# Patient Record
Sex: Female | Born: 1990 | Race: White | Hispanic: No | Marital: Single | Smoking: Current every day smoker
Health system: Southern US, Community
[De-identification: ages and names within clinical notes are randomized; demographics above are authoritative.]

## PROBLEM LIST (undated history)

## (undated) DIAGNOSIS — F419 Anxiety disorder, unspecified: Secondary | ICD-10-CM

## (undated) DIAGNOSIS — F329 Major depressive disorder, single episode, unspecified: Secondary | ICD-10-CM

## (undated) DIAGNOSIS — A749 Chlamydial infection, unspecified: Secondary | ICD-10-CM

## (undated) DIAGNOSIS — F32A Depression, unspecified: Secondary | ICD-10-CM

## (undated) DIAGNOSIS — A549 Gonococcal infection, unspecified: Secondary | ICD-10-CM

## (undated) DIAGNOSIS — R42 Dizziness and giddiness: Secondary | ICD-10-CM

## (undated) DIAGNOSIS — F4310 Post-traumatic stress disorder, unspecified: Secondary | ICD-10-CM

## (undated) DIAGNOSIS — H9319 Tinnitus, unspecified ear: Secondary | ICD-10-CM

## (undated) HISTORY — PX: APPENDECTOMY: SHX54

## (undated) HISTORY — DX: Tinnitus, unspecified ear: H93.19

## (undated) HISTORY — DX: Gonococcal infection, unspecified: A54.9

## (undated) HISTORY — DX: Chlamydial infection, unspecified: A74.9

## (undated) HISTORY — DX: Dizziness and giddiness: R42

---
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
## 2010-08-27 LAB — CBC
Hemoglobin: 11.1 g/dL — ABNORMAL LOW (ref 12.0–15.0)
MCHC: 33.8 g/dL (ref 30.0–36.0)
MCV: 92.7 fL (ref 78.0–100.0)
Platelets: 218 10*3/uL (ref 150–400)
RDW: 13.2 % (ref 11.5–15.5)
WBC: 14.3 10*3/uL — ABNORMAL HIGH (ref 4.0–10.5)

## 2010-08-27 LAB — CCBB MATERNAL DONOR DRAW

## 2013-08-28 ENCOUNTER — Emergency Department (HOSPITAL_COMMUNITY): Payer: Self-pay

## 2013-08-28 ENCOUNTER — Emergency Department (HOSPITAL_COMMUNITY)
Admission: EM | Admit: 2013-08-28 | Discharge: 2013-08-28 | Disposition: A | Discharge status: Home / Self Care | Payer: Self-pay | Attending: Emergency Medicine | Admitting: Emergency Medicine

## 2013-08-28 ENCOUNTER — Encounter (HOSPITAL_COMMUNITY): Payer: Self-pay | Admitting: Emergency Medicine

## 2013-08-28 DIAGNOSIS — S1093XA Contusion of unspecified part of neck, initial encounter: Principal | ICD-10-CM

## 2013-08-28 DIAGNOSIS — IMO0002 Reserved for concepts with insufficient information to code with codable children: Secondary | ICD-10-CM | POA: Insufficient documentation

## 2013-08-28 DIAGNOSIS — Z3202 Encounter for pregnancy test, result negative: Secondary | ICD-10-CM | POA: Insufficient documentation

## 2013-08-28 DIAGNOSIS — S0083XA Contusion of other part of head, initial encounter: Principal | ICD-10-CM

## 2013-08-28 DIAGNOSIS — F17200 Nicotine dependence, unspecified, uncomplicated: Secondary | ICD-10-CM | POA: Insufficient documentation

## 2013-08-28 DIAGNOSIS — G529 Cranial nerve disorder, unspecified: Secondary | ICD-10-CM | POA: Insufficient documentation

## 2013-08-28 DIAGNOSIS — S0003XA Contusion of scalp, initial encounter: Secondary | ICD-10-CM | POA: Insufficient documentation

## 2013-08-28 DIAGNOSIS — R209 Unspecified disturbances of skin sensation: Secondary | ICD-10-CM | POA: Insufficient documentation

## 2013-08-28 DIAGNOSIS — R200 Anesthesia of skin: Secondary | ICD-10-CM

## 2013-08-28 LAB — POC URINE PREG, ED: PREG TEST UR: NEGATIVE

## 2013-08-28 MED ORDER — ACETAMINOPHEN 325 MG PO TABS
650.0000 mg | ORAL_TABLET | Freq: Once | ORAL | Status: AC
  Administered 2013-08-28: 650 mg via ORAL
  Filled 2013-08-28: qty 2
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
## 2013-08-28 NOTE — Discharge Instructions (Signed)
Assault, General °Assault includes any behavior, whether intentional or reckless, which results in bodily injury to another person and/or damage to property. Included in this would be any behavior, intentional or reckless, that by its nature would be understood (interpreted) by a reasonable person as intent to harm another person or to damage his/her property. Threats may be oral or written. They may be communicated through regular mail, computer, fax, or phone. These threats may be direct or implied. °FORMS OF ASSAULT INCLUDE: °· Physically assaulting a person. This includes physical threats to inflict physical harm as well as: °· Slapping. °· Hitting. °· Poking. °· Kicking. °· Punching. °· Pushing. °· Arson. °· Sabotage. °· Equipment vandalism. °· Damaging or destroying property. °· Throwing or hitting objects. °· Displaying a weapon or an object that appears to be a weapon in a threatening manner. °· Carrying a firearm of any kind. °· Using a weapon to harm someone. °· Using greater physical size/strength to intimidate another. °· Making intimidating or threatening gestures. °· Bullying. °· Hazing. °· Intimidating, threatening, hostile, or abusive language directed toward another person. °· It communicates the intention to engage in violence against that person. And it leads a reasonable person to expect that violent behavior may occur. °· Stalking another person. °IF IT HAPPENS AGAIN: °· Immediately call for emergency help (911 in [place] °· If someone poses clear and immediate danger to you, seek legal authorities to have a protective or restraining order put in place. °· Less threatening assaults can at least be reported to authorities. °STEPS TO TAKE IF A SEXUAL ASSAULT HAS HAPPENED °· Go to an area of safety. This may include a shelter or staying with a friend. Stay away from the area where you have been attacked. A large percentage of sexual assaults are caused by a friend, relative or associate. °· If  medications were given by your caregiver, take them as directed for the full length of time prescribed. °· Only take over-the-counter or prescription medicines for pain, discomfort, or fever as directed by your caregiver. °· If you have come in contact with a sexual disease, find out if you are to be tested again. If your caregiver is concerned about the HIV/AIDS virus, he/she may require you to have continued testing for several months. °· For the protection of your privacy, test results can not be given over the phone. Make sure you receive the results of your test. If your test results are not back during your visit, make an appointment with your caregiver to find out the results. Do not assume everything is normal if you have not heard from your caregiver or the medical facility. It is important for you to follow up on all of your test results. °· File appropriate papers with authorities. This is important in all assaults, even if it has occurred in a family or by a friend. °SEEK MEDICAL CARE IF: °· You have new problems because of your injuries. °· You have problems that may be because of the medicine you are taking, such as: °· Rash. °· Itching. °· Swelling. °· Trouble breathing. °· You develop belly (abdominal) pain, feel sick to your stomach (nausea) or are vomiting. °· You begin to run a temperature. °· You need supportive care or referral to a rape crisis center. These are centers with trained personnel who can help you get through this ordeal. °SEEK IMMEDIATE MEDICAL CARE IF: °· You are afraid of being threatened, beaten, or abused. In [place] call 911. °· You   receive new injuries related to abuse. °· You develop severe pain in any area injured in the assault or have any change in your condition that concerns you. °· You faint or lose consciousness. °· You develop chest pain or shortness of breath. °Document Released: 05/12/2005 Document Revised: 08/04/2011 Document Reviewed: 12/29/2007 °ExitCare® Patient  Information ©2014 [employer]. ° ° ° ° °Emergency Department Resource Guide °1) Find a Doctor and Pay Out of Pocket °Although you won't have to find out who is covered by your insurance plan, it is a good idea to ask around and get recommendations. You will then need to call the office and see if the doctor you have chosen will accept you as a new patient and what types of options they offer for patients who are self-pay. Some doctors offer discounts or will set up payment plans for their patients who do not have insurance, but you will need to ask so you aren't surprised when you get to your appointment. ° °2) Contact Your Local Health Department °Not all health departments have doctors that can see patients for sick visits, but many do, so it is worth a call to see if yours does. If you don't know where your local health department is, you can check in your phone book. The CDC also has a tool to help you locate your state's health department, and many state websites also have listings of all of their local health departments. ° °3) Find a Walk-in Clinic °If your illness is not likely to be very severe or complicated, you may want to try a walk in clinic. These are popping up all over the country in pharmacies, drugstores, and shopping centers. They're usually staffed by nurse practitioners or physician assistants that have been trained to treat common illnesses and complaints. They're usually fairly quick and inexpensive. However, if you have serious medical issues or chronic medical problems, these are probably not your best option. ° °No Primary Care Doctor: °- Call [facility] at  832-8000 - they can help you locate a primary care doctor that  accepts your insurance, provides certain services, etc. °- Physician Referral Service- 1-800-533-3463 ° °Chronic Pain Problems: °Organization         Address  Phone   Notes  °[facility]  (336) 297-2271 Patients need to be referred by their  primary care doctor.  ° °Medication Assistance: °Organization         Address  Phone   Notes  °[place] [facility] [address] °[place], [place] [address] (336) 641-8030 --Must be a resident of [place] °-- Must have NO insurance coverage whatsoever (no Medicaid/ Medicare, etc.) °-- The pt. MUST have a primary care doctor that directs their care regularly and follows them in the community °  °[facility]  (866) 331-1348   °[employer]  (888) 892-1162   ° °Agencies that provide inexpensive medical care: °Organization         Address  Phone   Notes  °[facility]  (336) 832-8035   °[facility]    (336) 832-7272   °[facility] [address] °[place], [place] [address] (336) 832-4777   °[facility] [address], °[place] (336) 271-4999   °[facility]    (336) 373-0678   °[facility]    (336) 272-1050   °[facility] ° [address], [place] Phone:  (  336) 832-4444, Fax:  (336) 832-4440 Hours of Operation:  9 am - 6 pm, M-F.  Also accepts Medicaid/Medicare and self-pay.  °[facility] ° [address], [place] Phone: (336) 832-3150, Fax: (336) 832-3151. Hours of Operation:  8:30 am - 5:30 pm, M-F.  Also accepts Medicaid and self-pay.  °[facility] [place] [address], [place] Phone: (336) 878-6027   °[facility] [address], [place], [place] (336)723-1848, Ext. 123 Mondays & Thursdays: 7-9 AM.  First 15 patients are seen on a first come, first serve basis. °  ° °Medicaid-accepting [place] Providers: ° °Organization         Address  Phone   Notes  °[facility] [address], [place] (336) 641-2100 Also accepts self-pay patients.  °[facility] [address], [place] ° (336) 856-9996   °[facility] [address], [place] (336) 288-8857   °[facility] [address] [place] [address], [place] (336) 299-7000   °Veita Bland [address], [place]  ° (336) 373-1557 Only accepts [place] Access Medicaid patients after they have their name applied to their card.  ° °Self-Pay (no insurance) in [place]: ° °Organization         Address  Phone   Notes  °Sickle Cell Patients, [facility] [address], [place] (336) 832-1970   °[facility] [address], [place] (336) 832-4400   °[facility] ° [address], [place] (336) 992-4800   °[facility]/Dr. Osei-Bonsu ° [address] [place] [address], [place] or [address], [place] (336) 841-8500 Phone number for both [place] and [place] locations is the same.  °[facility] [address], [place] (336) 299-0000   °[facility] [address] [place] [address], [place] or [address] (336) 852-7530 °(336) 878-2260   °[facility] [address], [place] (336) 350-1642, phone; (336) 294-5005, fax Sees patients 1st and 3rd Saturday of every month.  Must not qualify for public or private insurance (i.e. Medicaid, Medicare, [place] Health Choice, Veterans' Benefits) • Household income should be no more than 200% of the poverty level •The clinic cannot treat you if you are pregnant or think you are pregnant • Sexually transmitted diseases are not treated at the clinic.  ° ° °Dental Care: °Organization         Address  Phone  Notes  °[facility] [address], [place] (336) 641-6152 Accepts children up to age 21 who are enrolled in Medicaid or [place] Health Choice; pregnant women with a Medicaid card; and children who have applied for Medicaid or [place] Health Choice, but were declined, whose parents can pay a reduced fee  at time of service.  °[facility] [place]  [address], [place] (336) 641-7733 Accepts children up to age 21 who are enrolled in Medicaid or [place] Health Choice; pregnant women with a Medicaid card; and children who have applied for Medicaid or [place] Health Choice, but were declined, whose parents can pay a reduced fee at time of service.  °[facility] ° [address],  (336) 641-4533 Patients are seen by appointment only. Walk-ins   are not accepted. [facility] will see patients 18 years of age and older. °Monday - Tuesday (8am-5pm) °Most Wednesdays (8:30-5pm) °$30 per visit, cash only  °[facility] ° [address], [place] (336) 641-4533 Patients are seen by appointment only. Walk-ins are not accepted. [facility] will see patients 18 years of age and older. °One Wednesday Evening (Monthly: Volunteer Based).  $30 per visit, cash only  °[facility]  (919) 537-3737 for adults; Children under age 4, call Graduate Pediatric Dentistry at ([phone]) [phone]. Children aged 4-14, please call ([phone]) [phone] to request a pediatric application. ° Dental services are provided in all areas of dental care including fillings, crowns and bridges, complete and partial dentures, implants, gum treatment, root canals, and extractions. Preventive care is also provided. Treatment is provided to both adults and children. °Patients are selected via a lottery and there is often a waiting list. °  °[facility] [address], °[place] ° (336) 763-8833 [email or web address] °  °[facility] [address], [place], [place] (336)723-1848, Ext. 123 Second and Fourth Thursday of each month, opens at 6:30 AM; Clinic ends at 9 AM.  Patients are seen on a first-come first-served basis, and a limited number are seen during each clinic.  ° °[facility] ° [address],  [place], [place] (336) 723-7904   Eligibility Requirements °You must have lived in [place], [place], or [place] counties for at least the last three months. °  You cannot be eligible for state or federal sponsored healthcare insurance, including Veterans Administration, Medicaid, or Medicare. °  You generally cannot be eligible for healthcare insurance through your employer.  °  How to apply: °Eligibility screenings are held every Tuesday and Wednesday afternoon from 1:00 pm until 4:00 pm. You do not need an appointment for the interview!  °[facility] [address], [place], [place] 336-631-2330   °[facility]  336-342-8273   °[facility]  336-703-3100   °[facility]  336-570-6415   ° °Behavioral Health Resources in the Community: °Intensive Outpatient Programs °Organization         Address  Phone  Notes  °[facility] [address], [place], [place] 336-878-6098   °[facility] [address], [place], [place] 336-832-9800   °[facility] [address], [place], [place] ° 336-882-2125   °[facility] [address],  °[place], [place] 1-800-853-5163 or 336-641-4981   °Substance Abuse Resources °Organization         Address  Phone  Notes  °[facility]  336-882-2125   °[facility]  336-784-9470   °[facility]  336-285-9073   °[facility]  336-845-3988   °Residential & Outpatient Substance Abuse Program  1-800-659-3381   °Psychological Services °Organization         Address  Phone  Notes  °[facility]  336- 832-9600   °[facility]  336- 378-7881   °[facility] [address], [place] 1-800-853-5163 or 336-641-4981   ° °Mobile Crisis Teams °Organization         Address  Phone  Notes  °[facility], Mobile Crisis Care Unit  1-877-626-1772   °Assertive °Psychotherapeutic  Services ° [address] [place], [place] 336-834-9664   °Sharon DeEsch [address] °[place]  336-554-5454   ° °  Self-Help/Support Groups °Organization         Address  Phone             Notes  °[facility] - variety of support groups  336- 373-1402 Call for more information  °Narcotics Anonymous (NA), [facility] [address], °[place]  2 meetings at this location  ° °Residential Treatment Programs °Organization         Address  Phone  Notes  °[facility] [address],    °[place]  1-866-801-8205   °[facility] ° [address], [place], [place] 704-293-8524   °[facility] [address], [place] 336-845-3988 Admissions: 8am-3pm M-F  °[facility] [address]    °[place], [place] 336-841-1104   °[facility] [address], [place], [place] 336-379-7146   °[facility] [address]  °[place], [place] 336-285-9073   °[facility] - Intensive Outpatient [address], [place], [place] 336-852-3033   °[facility] [address]  °[place], [place] 1-877-615-2722 or 336-784-9470   °[facility] [address] [place], [place] 336-227-7417 Accepts Medicaid  °[facility] [address]  °[place] 1-800-659-3381 Substance Abuse/Addiction Treatment  ° °[place] Behavioral Health Resources °Organization         Address  Phone  Notes  °[facility]  (888) 581-9988   °Julie Brannon, PhD [address] [place], [place]   (336) 349-5553 or (336) 951-0000   °[facility]   [address] °[place], [place] (336) 349-4454   °[facility] [address], [place], [place] (336) 342-8316 Insurance/Medicaid/sponsorship through [facility]  °[facility] [address]                                    [place], [place] (336)  342-8316 Therapy/tele-psych/case  °[facility] [address]  ° [place], [place] (336) 349-2233    °Dr. Arfeen  (336) 349-4544   °[facility] [place]  [employer] [place] [facility] 1) [address], [place] °2) [address], [place] °3)  [address], [place] (336) 349-3220 °(336) 342-7768 ° °(336) 342-8140   °[place] [facility] (336) 342-1394 or (336) 342-3537 (After Hours)    ° °  °
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
## 2013-08-28 NOTE — ED Notes (Signed)
lmp 2 weeks ago 

## 2013-08-28 NOTE — ED Notes (Signed)
Password for patient is  [phone]

## 2013-08-28 NOTE — ED Notes (Signed)
Pt returned from Radiology.  Reports pain 9/10 to face/head.

## 2014-02-24 ENCOUNTER — Emergency Department (HOSPITAL_COMMUNITY)
Admission: EM | Admit: 2014-02-24 | Discharge: 2014-02-24 | Disposition: A | Discharge status: Home / Self Care | Payer: Self-pay | Attending: Emergency Medicine | Admitting: Emergency Medicine

## 2014-02-24 ENCOUNTER — Encounter (HOSPITAL_COMMUNITY): Payer: Self-pay | Admitting: Emergency Medicine

## 2014-02-24 DIAGNOSIS — Z3202 Encounter for pregnancy test, result negative: Secondary | ICD-10-CM | POA: Insufficient documentation

## 2014-02-24 DIAGNOSIS — T148XXA Other injury of unspecified body region, initial encounter: Secondary | ICD-10-CM

## 2014-02-24 DIAGNOSIS — F419 Anxiety disorder, unspecified: Secondary | ICD-10-CM | POA: Insufficient documentation

## 2014-02-24 DIAGNOSIS — R0602 Shortness of breath: Secondary | ICD-10-CM | POA: Insufficient documentation

## 2014-02-24 DIAGNOSIS — F329 Major depressive disorder, single episode, unspecified: Secondary | ICD-10-CM | POA: Insufficient documentation

## 2014-02-24 DIAGNOSIS — R110 Nausea: Secondary | ICD-10-CM | POA: Insufficient documentation

## 2014-02-24 DIAGNOSIS — M7981 Nontraumatic hematoma of soft tissue: Secondary | ICD-10-CM | POA: Insufficient documentation

## 2014-02-24 DIAGNOSIS — Z720 Tobacco use: Secondary | ICD-10-CM | POA: Insufficient documentation

## 2014-02-24 DIAGNOSIS — Z79899 Other long term (current) drug therapy: Secondary | ICD-10-CM | POA: Insufficient documentation

## 2014-02-24 DIAGNOSIS — R079 Chest pain, unspecified: Secondary | ICD-10-CM | POA: Insufficient documentation

## 2014-02-24 HISTORY — DX: Anxiety disorder, unspecified: F41.9

## 2014-02-24 HISTORY — DX: Major depressive disorder, single episode, unspecified: F32.9

## 2014-02-24 HISTORY — DX: Depression, unspecified: F32.A

## 2014-02-24 HISTORY — DX: Post-traumatic stress disorder, unspecified: F43.10

## 2014-02-24 LAB — COMPREHENSIVE METABOLIC PANEL
ALBUMIN: 4.1 g/dL (ref 3.5–5.2)
ALK PHOS: 76 U/L (ref 39–117)
ALT: 25 U/L (ref 0–35)
ANION GAP: 12 (ref 5–15)
AST: 33 U/L (ref 0–37)
BILIRUBIN TOTAL: 0.3 mg/dL (ref 0.3–1.2)
BUN: 11 mg/dL (ref 6–23)
CHLORIDE: 98 meq/L (ref 96–112)
CO2: 25 meq/L (ref 19–32)
CREATININE: 0.64 mg/dL (ref 0.50–1.10)
Calcium: 9.2 mg/dL (ref 8.4–10.5)
GLUCOSE: 85 mg/dL (ref 70–99)
POTASSIUM: 4.5 meq/L (ref 3.7–5.3)
Sodium: 135 mEq/L — ABNORMAL LOW (ref 137–147)
Total Protein: 7.7 g/dL (ref 6.0–8.3)

## 2014-02-24 LAB — CBC WITH DIFFERENTIAL/PLATELET
Basophils Absolute: 0 10*3/uL (ref 0.0–0.1)
Basophils Relative: 1 % (ref 0–1)
Eosinophils Absolute: 0.2 10*3/uL (ref 0.0–0.7)
Eosinophils Relative: 2 % (ref 0–5)
HEMATOCRIT: 40.9 % (ref 36.0–46.0)
HEMOGLOBIN: 14.2 g/dL (ref 12.0–15.0)
LYMPHS ABS: 2.3 10*3/uL (ref 0.7–4.0)
Lymphocytes Relative: 36 % (ref 12–46)
MCH: 32.6 pg (ref 26.0–34.0)
MCHC: 34.7 g/dL (ref 30.0–36.0)
MCV: 94 fL (ref 78.0–100.0)
MONO ABS: 0.5 10*3/uL (ref 0.1–1.0)
MONOS PCT: 7 % (ref 3–12)
NEUTROS ABS: 3.5 10*3/uL (ref 1.7–7.7)
NEUTROS PCT: 54 % (ref 43–77)
Platelets: 192 10*3/uL (ref 150–400)
RBC: 4.35 MIL/uL (ref 3.87–5.11)
RDW: 12.2 % (ref 11.5–15.5)
WBC: 6.5 10*3/uL (ref 4.0–10.5)

## 2014-02-24 LAB — PROTIME-INR
INR: 1.04 (ref 0.00–1.49)
Prothrombin Time: 13.6 seconds (ref 11.6–15.2)

## 2014-02-24 LAB — SEDIMENTATION RATE: SED RATE: 1 mm/h (ref 0–22)

## 2014-02-24 LAB — PREGNANCY, URINE: PREG TEST UR: NEGATIVE

## 2014-02-24 LAB — APTT: aPTT: 31 seconds (ref 24–37)

## 2014-02-24 MED ORDER — ONDANSETRON 4 MG PO TBDP: 4.0000 mg | ORAL_TABLET | Freq: Three times a day (TID) | ORAL | Status: DC | PRN

## 2014-02-24 MED ORDER — ONDANSETRON HCL 4 MG/2ML IJ SOLN
4.0000 mg | Freq: Once | INTRAMUSCULAR | Status: AC
  Administered 2014-02-24: 4 mg via INTRAVENOUS
  Filled 2014-02-24: qty 2

## 2014-02-24 NOTE — ED Notes (Addendum)
Intermittent rashes for past 3 days. Reports tiny bruising to legs, Cramps in back, nausea. Redness to face, no diff breathing noted. Also reports bleeding gums for past few weeks while brushing teeth.

## 2014-02-24 NOTE — Discharge Instructions (Signed)
Contusion °A contusion is a deep bruise. Contusions happen when an injury causes bleeding under the skin. Signs of bruising include pain, puffiness (swelling), and discolored skin. The contusion may turn blue, purple, or yellow. °HOME CARE  °· Put ice on the injured area. °¨ Put ice in a plastic bag. °¨ Place a towel between your skin and the bag. °¨ Leave the ice on for 15-20 minutes, 03-04 times a day. °· Only take medicine as told by your doctor. °· Rest the injured area. °· If possible, raise (elevate) the injured area to lessen puffiness. °GET HELP RIGHT AWAY IF:  °· You have more bruising or puffiness. °· You have pain that is getting worse. °· Your puffiness or pain is not helped by medicine. °MAKE SURE YOU:  °· Understand these instructions. °· Will watch your condition. °· Will get help right away if you are not doing well or get worse. °Document Released: 10/29/2007 Document Revised: 08/04/2011 Document Reviewed: 03/17/2011 °[employer]® Patient Information ©2015 [employer]. This information is not intended to replace advice given to you by your health care provider. Make sure you discuss any questions you have with your health care provider. ° °

## 2014-02-24 NOTE — ED Provider Notes (Signed)
CSN: [id number]     Arrival date & time 02/24/14  [id number] History   First MD Initiated Contact with Patient 02/24/14 [phone]     Chief Complaint  Patient presents with  . Rash  . Nausea     (Consider location/radiation/quality/duration/timing/severity/associated sxs/prior Treatment) Patient is a 23 y.o. female presenting with rash.  Rash Location:  Full body Quality: redness   Quality: not itchy   Severity:  Moderate Onset quality:  Gradual Duration:  3 days Timing:  Constant Progression:  Waxing and waning Chronicity:  New Context comment:  Tick bite 1 month ago Relieved by:  None tried Worsened by:  Nothing tried Ineffective treatments:  None tried Associated symptoms: nausea and shortness of breath   Associated symptoms: no abdominal pain, no diarrhea, no fever, no headaches, no joint pain, no myalgias, no sore throat, no URI, not vomiting and not wheezing     Past Medical History  Diagnosis Date  . Depression   . Anxiety   . PTSD (post-traumatic stress disorder)    Past Surgical History  Procedure Laterality Date  . Appendectomy     No family history on file. History  Substance Use Topics  . Smoking status: Current Every Day Smoker  . Smokeless tobacco: Not on file  . Alcohol Use: No   OB History   Grav Para Term Preterm Abortions TAB SAB Ect Mult Living                 Review of Systems  Constitutional: Negative for fever and chills.  HENT: Negative for congestion and sore throat.   Eyes: Negative for visual disturbance.  Respiratory: Positive for shortness of breath. Negative for cough and wheezing.   Cardiovascular: Positive for chest pain.  Gastrointestinal: Positive for nausea. Negative for vomiting, abdominal pain, diarrhea and constipation.  Genitourinary: Negative for dysuria, difficulty urinating and vaginal pain.  Musculoskeletal: Negative for arthralgias and myalgias.  Skin: Positive for rash.  Neurological: Negative for syncope and headaches.   Hematological: Bruises/bleeds easily.  Psychiatric/Behavioral: Negative for behavioral problems.  All other systems reviewed and are negative.     Allergies  Review of patient's allergies indicates no known allergies.  Home Medications   Prior to Admission medications   Medication Sig Start Date End Date Taking? Authorizing Provider  clonazePAM (KLONOPIN) 0.5 MG tablet Take 0.5 mg by mouth 2 (two) times daily.   Yes Historical Provider, MD  traZODone (DESYREL) 50 MG tablet Take 100 mg by mouth at bedtime.   Yes Historical Provider, MD  venlafaxine XR (EFFEXOR-XR) 150 MG 24 hr capsule Take 150 mg by mouth daily.   Yes Historical Provider, MD  ondansetron (ZOFRAN ODT) 4 MG disintegrating tablet Take 1 tablet (4 mg total) by mouth every 8 (eight) hours as needed for nausea or vomiting. 02/24/14   Beverely Risenennis Roseanne Juenger, MD   Ht 4\' 11"  (1.499 m)  Wt 111 lb (50.349 kg)  BMI 22.41 kg/m2  LMP 02/20/2014 Physical Exam  Constitutional: She is oriented to person, place, and time. She appears well-developed and well-nourished. No distress.  HENT:  Head: Normocephalic and atraumatic.  Eyes: EOM are normal.  Neck: Normal range of motion.  Cardiovascular: Normal rate, regular rhythm and normal heart sounds.   No murmur heard. Pulmonary/Chest: Effort normal and breath sounds normal. No respiratory distress. She has no wheezes.  Abdominal: Soft. There is no tenderness.  Musculoskeletal: She exhibits no edema.  Neurological: She is alert and oriented to person, place, and time.  Skin:  Bruising and rash noted. She is not diaphoretic. There is erythema.  Psychiatric: She has a normal mood and affect. Her behavior is normal.    ED Course  Procedures (including critical care time) Labs Review Labs Reviewed  COMPREHENSIVE METABOLIC PANEL - Abnormal; Notable for the following:    Sodium 135 (*)    All other components within normal limits  PREGNANCY, URINE  CBC WITH DIFFERENTIAL  APTT  PROTIME-INR   SEDIMENTATION RATE    Imaging Review No results found.   EKG Interpretation None      MDM   Final diagnoses:  Bruising  Nausea    Patient is a 23 year old female with history significant for depression and anxiety that presents with 3 days of rash, nausea, bruising. Patient also states she has had bleeding of her gums. Patient states one month ago she pulled a tick from the back of her head however denies any fever. Patient states that the rash started as red bumps on her face that has since gone away. Rash then went to her upper extremities. Yesterday the patient noticed bruising diffusely on her lower extremities bilaterally and denies trauma. Patient states she is also noticed bleeding of her gums this started 2 days ago. Patient denies any family history of bleeding disorder and is taking no new medications. Patient is currently taking Effexor, Trazodone, and clonopin.  Patient's lab work unremarkable. Patient given resources to establish with a primary care. Patient given a return precautions which he voiced understanding. Patient discharged in good condition    Beverely Risen, MD 02/24/14 1159

## 2014-02-24 NOTE — ED Provider Notes (Signed)
I saw and evaluated the patient, reviewed the resident's note and I agree with the findings and plan.   EKG Interpretation None     I have seen the patient and reviewed her history present illness and review of systems. At this point time I have low suspicion for any zoo and not infection despite this history of a tick bite approximately a month ago. There are no typical symptoms of a febrile illness headaches myalgias URI/respiratory type symptoms. She is well in appearance. She describes the urticarial rash which occurred approximately 3 days ago with erythema of her face and upper extremities that then resolved and now she has a few residual papular lesions that have no classic distribution and actually appear more consistent with a contact dermatitis very mild in nature. Examination of her lower extremities does show variable bruising a few of which are clearly from minor injuries. Again there is no petechiae no nodules to suggest erythema nodosum. Examination of patient oral cavity shows that she has gingivitis and plaque, and bleed any bleeding of the gums is secondary to poor oral hygiene. The palate and the buccal mucosa are free of any petechial type lesions. Patient's diagnostic workup is negative. She is discharged in stable condition.  Arby BarretteMarcy Zivah Mayr, MD 02/24/14 [phone]

## 2014-02-24 NOTE — ED Provider Notes (Signed)
I saw and evaluated the patient, reviewed the resident's note and I agree with the findings and plan.   EKG Interpretation None       Arby BarretteMarcy Braxtyn Dorff, MD 02/24/14 1655

## 2014-02-24 NOTE — ED Notes (Signed)
MD at bedside. Cheyenne Kelly.

## 2014-06-22 ENCOUNTER — Emergency Department (HOSPITAL_COMMUNITY): Payer: Self-pay

## 2014-06-22 ENCOUNTER — Emergency Department (HOSPITAL_COMMUNITY)
Admission: EM | Admit: 2014-06-22 | Discharge: 2014-06-22 | Disposition: A | Discharge status: Home / Self Care | Payer: Self-pay | Attending: Emergency Medicine | Admitting: Emergency Medicine

## 2014-06-22 ENCOUNTER — Encounter (HOSPITAL_COMMUNITY): Payer: Self-pay

## 2014-06-22 DIAGNOSIS — R0602 Shortness of breath: Secondary | ICD-10-CM | POA: Insufficient documentation

## 2014-06-22 DIAGNOSIS — R0789 Other chest pain: Secondary | ICD-10-CM | POA: Insufficient documentation

## 2014-06-22 DIAGNOSIS — Z79899 Other long term (current) drug therapy: Secondary | ICD-10-CM | POA: Insufficient documentation

## 2014-06-22 DIAGNOSIS — R112 Nausea with vomiting, unspecified: Secondary | ICD-10-CM | POA: Insufficient documentation

## 2014-06-22 DIAGNOSIS — F419 Anxiety disorder, unspecified: Secondary | ICD-10-CM | POA: Insufficient documentation

## 2014-06-22 DIAGNOSIS — F329 Major depressive disorder, single episode, unspecified: Secondary | ICD-10-CM | POA: Insufficient documentation

## 2014-06-22 DIAGNOSIS — Z720 Tobacco use: Secondary | ICD-10-CM | POA: Insufficient documentation

## 2014-06-22 DIAGNOSIS — F4310 Post-traumatic stress disorder, unspecified: Secondary | ICD-10-CM | POA: Insufficient documentation

## 2014-06-22 DIAGNOSIS — Z3202 Encounter for pregnancy test, result negative: Secondary | ICD-10-CM | POA: Insufficient documentation

## 2014-06-22 DIAGNOSIS — R1084 Generalized abdominal pain: Secondary | ICD-10-CM | POA: Insufficient documentation

## 2014-06-22 LAB — COMPREHENSIVE METABOLIC PANEL
ALBUMIN: 4.5 g/dL (ref 3.5–5.2)
ALT: 32 U/L (ref 0–35)
ANION GAP: 10 (ref 5–15)
AST: 35 U/L (ref 0–37)
Alkaline Phosphatase: 71 U/L (ref 39–117)
BUN: 8 mg/dL (ref 6–23)
CHLORIDE: 103 mmol/L (ref 96–112)
CO2: 23 mmol/L (ref 19–32)
Calcium: 9.1 mg/dL (ref 8.4–10.5)
Creatinine, Ser: 0.59 mg/dL (ref 0.50–1.10)
GFR calc non Af Amer: >90 mL/min (ref >90)
GLUCOSE: 84 mg/dL (ref 70–99)
POTASSIUM: 3.6 mmol/L (ref 3.5–5.1)
SODIUM: 136 mmol/L (ref 135–145)
TOTAL PROTEIN: 7.2 g/dL (ref 6.0–8.3)
Total Bilirubin: 0.7 mg/dL (ref 0.3–1.2)

## 2014-06-22 LAB — CBC WITH DIFFERENTIAL/PLATELET
BASOS ABS: 0 10*3/uL (ref 0.0–0.1)
Basophils Relative: 0 % (ref 0–1)
Eosinophils Absolute: 0.1 10*3/uL (ref 0.0–0.7)
Eosinophils Relative: 1 % (ref 0–5)
HEMATOCRIT: 37.2 % (ref 36.0–46.0)
HEMOGLOBIN: 13 g/dL (ref 12.0–15.0)
LYMPHS ABS: 2.1 10*3/uL (ref 0.7–4.0)
Lymphocytes Relative: 30 % (ref 12–46)
MCH: 32.4 pg (ref 26.0–34.0)
MCHC: 34.9 g/dL (ref 30.0–36.0)
MCV: 92.8 fL (ref 78.0–100.0)
Monocytes Absolute: 0.5 10*3/uL (ref 0.1–1.0)
Monocytes Relative: 7 % (ref 3–12)
NEUTROS ABS: 4.4 10*3/uL (ref 1.7–7.7)
Neutrophils Relative %: 62 % (ref 43–77)
Platelets: 184 10*3/uL (ref 150–400)
RBC: 4.01 MIL/uL (ref 3.87–5.11)
RDW: 12 % (ref 11.5–15.5)
WBC: 7.1 10*3/uL (ref 4.0–10.5)

## 2014-06-22 LAB — URINALYSIS, ROUTINE W REFLEX MICROSCOPIC
BILIRUBIN URINE: NEGATIVE
Glucose, UA: NEGATIVE mg/dL
HGB URINE DIPSTICK: NEGATIVE
KETONES UR: NEGATIVE mg/dL
LEUKOCYTES UA: NEGATIVE
Nitrite: NEGATIVE
Protein, ur: NEGATIVE mg/dL
Specific Gravity, Urine: 1.004 — ABNORMAL LOW (ref 1.005–1.030)
UROBILINOGEN UA: 0.2 mg/dL (ref 0.0–1.0)
pH: 6.5 (ref 5.0–8.0)

## 2014-06-22 LAB — POC URINE PREG, ED: Preg Test, Ur: NEGATIVE

## 2014-06-22 LAB — I-STAT TROPONIN, ED: TROPONIN I, POC: 0 ng/mL (ref 0.00–0.08)

## 2014-06-22 MED ORDER — KETOROLAC TROMETHAMINE 30 MG/ML IJ SOLN
30.0000 mg | Freq: Once | INTRAMUSCULAR | Status: AC
  Administered 2014-06-22: 30 mg via INTRAMUSCULAR
  Filled 2014-06-22: qty 1

## 2014-06-22 MED ORDER — ONDANSETRON HCL 4 MG PO TABS: 4.0000 mg | ORAL_TABLET | Freq: Three times a day (TID) | ORAL | Status: DC | PRN

## 2014-06-22 MED ORDER — NAPROXEN 375 MG PO TABS: 375.0000 mg | ORAL_TABLET | Freq: Two times a day (BID) | ORAL | Status: DC

## 2014-06-22 NOTE — ED Provider Notes (Signed)
CSN: [id number]     Arrival date & time 06/22/14  1814 History   First MD Initiated Contact with Patient 06/22/14 1903     Chief Complaint  Patient presents with  . Chest Pain  . Emesis     (Consider location/radiation/quality/duration/timing/severity/associated sxs/prior Treatment) Patient is a 24 y.o. female presenting with chest pain and vomiting. The history is provided by the patient.  Chest Pain Pain location:  L chest Pain quality: shooting   Pain radiates to:  Upper back Pain radiates to the back: yes   Pain severity:  Moderate Onset quality:  Gradual Duration:  2 weeks Timing:  Constant Progression:  Unchanged Chronicity:  New Context: at rest   Relieved by:  Nothing Worsened by:  Nothing tried Ineffective treatments:  None tried Associated symptoms: abdominal pain, nausea, shortness of breath and vomiting   Associated symptoms: no back pain, no cough, no dizziness, no fatigue, no fever and no headache   Abdominal pain:    Location:  Generalized   Quality:  Cramping   Severity:  Mild   Onset quality:  Gradual   Duration:  2 weeks   Timing:  Constant   Progression:  Unchanged   Chronicity:  New Vomiting:    Quality:  Stomach contents   Severity:  Mild   Duration:  1 month   Timing:  Sporadic   Progression:  Unchanged Emesis Associated symptoms: abdominal pain   Associated symptoms: no diarrhea and no headaches     Past Medical History  Diagnosis Date  . Depression   . Anxiety   . PTSD (post-traumatic stress disorder)    Past Surgical History  Procedure Laterality Date  . Appendectomy     History reviewed. No pertinent family history. History  Substance Use Topics  . Smoking status: Current Every Day Smoker  . Smokeless tobacco: Not on file  . Alcohol Use: No   OB History    No data available     Review of Systems  Constitutional: Negative for fever and fatigue.  HENT: Negative for congestion and drooling.   Eyes: Negative for pain.   Respiratory: Positive for shortness of breath. Negative for cough.   Cardiovascular: Positive for chest pain.  Gastrointestinal: Positive for nausea, vomiting and abdominal pain. Negative for diarrhea.  Genitourinary: Negative for dysuria and hematuria.  Musculoskeletal: Negative for back pain, gait problem and neck pain.  Skin: Negative for color change.  Neurological: Negative for dizziness and headaches.  Hematological: Negative for adenopathy.  Psychiatric/Behavioral: Negative for behavioral problems.  All other systems reviewed and are negative.     Allergies  Review of patient's allergies indicates no known allergies.  Home Medications   Prior to Admission medications   Medication Sig Start Date End Date Taking? Authorizing Provider  clonazePAM (KLONOPIN) 0.5 MG tablet Take 0.5 mg by mouth 2 (two) times daily.    Historical Provider, MD  ondansetron (ZOFRAN ODT) 4 MG disintegrating tablet Take 1 tablet (4 mg total) by mouth every 8 (eight) hours as needed for nausea or vomiting. 02/24/14   Beverely Risenennis Carr, MD  traZODone (DESYREL) 50 MG tablet Take 100 mg by mouth at bedtime.    Historical Provider, MD  venlafaxine XR (EFFEXOR-XR) 150 MG 24 hr capsule Take 150 mg by mouth daily.    Historical Provider, MD   BP 139/93 mmHg  Pulse 77  Temp(Src) 97.5 F (36.4 C) (Oral)  Resp 18  SpO2 97%  LMP 06/17/2014 Physical Exam  Constitutional: She is oriented  to person, place, and time. She appears well-developed and well-nourished.  HENT:  Head: Normocephalic.  Mouth/Throat: Oropharynx is clear and moist. No oropharyngeal exudate.  Eyes: Conjunctivae and EOM are normal. Pupils are equal, round, and reactive to light.  Neck: Normal range of motion. Neck supple.  Cardiovascular: Normal rate, regular rhythm, normal heart sounds and intact distal pulses.  Exam reveals no gallop and no friction rub.   No murmur heard. Pulmonary/Chest: Effort normal and breath sounds normal. No respiratory  distress. She has no wheezes. She exhibits tenderness (Tenderness to palpation of the left upper anterior chest and left lateral chest wall.).  Abdominal: Soft. Bowel sounds are normal. There is no tenderness. There is no rebound and no guarding.  Musculoskeletal: Normal range of motion. She exhibits no edema or tenderness.  Neurological: She is alert and oriented to person, place, and time.  Skin: Skin is warm and dry.  Psychiatric: She has a normal mood and affect. Her behavior is normal.  Nursing note and vitals reviewed.   ED Course  Procedures (including critical care time) Labs Review Labs Reviewed  URINALYSIS, ROUTINE W REFLEX MICROSCOPIC - Abnormal; Notable for the following:    Specific Gravity, Urine 1.004 (*)    All other components within normal limits  CBC WITH DIFFERENTIAL/PLATELET  COMPREHENSIVE METABOLIC PANEL  I-STAT TROPOININ, ED  POC URINE PREG, ED    Imaging Review Dg Chest 2 View  06/22/2014   CLINICAL DATA:  Vomiting several times a day for several weeks. Left side chest pain radiating to the back with shortness of breath. Smoker.  EXAM: CHEST  2 VIEW  COMPARISON:  PA and lateral chest 08/28/2013.  FINDINGS: The lungs are clear. Heart size is normal. There is no pneumothorax or pleural effusion. Mild convex right thoracolumbar scoliosis is noted.  IMPRESSION: No acute disease.   Electronically Signed   By: Drusilla Kanner M.D.   On: 06/22/2014 19:13     EKG Interpretation   Date/Time:  Thursday June 22 2014 18:23:57 EST Ventricular Rate:  81 PR Interval:  134 QRS Duration: 82 QT Interval:  352 QTC Calculation: 408 R Axis:   90 Text Interpretation:  Normal sinus rhythm Rightward axis Septal infarct ,  age undetermined Confirmed by Vianne Grieshop  MD, Alonso Gapinski ([id number]) on 06/22/2014  7:04:00 PM      MDM   Final diagnoses:  Other chest pain  Generalized abdominal pain    7:49 PM 24 y.o. female with a history of depression, anxiety, PTSD who presents with  multiple complaints. She states that she has had vomiting daily for the last 5 weeks. She also complains of left-sided sharp chest pain radiating to her back which has been constant for the last 1.5 weeks. She believes it may be related to anxiety. She also complains of diffuse abdominal cramping for the last 1.5 weeks. She states she feels short of breath. She is also had some urinary frequency. She denies any fevers. She is afebrile and vital signs are unremarkable here. Wells/Perc neg. lab work drawn prior to my evaluation is noncontributory. The patient is well-appearing on exam. Will get urinalysis and urine pregnancy. Her chest pain is reproducible on exam.  8:48 PM: I interpreted/reviewed the labs and/or imaging which were non-contributory.  Pt continues to appear well.  I have discussed the diagnosis/risks/treatment options with the patient and believe the pt to be eligible for discharge home to follow-up with and estab w/ a pcp. We also discussed returning to the ED immediately  if new or worsening sx occur. We discussed the sx which are most concerning (e.g., worsening pain, fever, concern for dehydration) that necessitate immediate return. Medications administered to the patient during their visit and any new prescriptions provided to the patient are listed below.  Medications given during this visit Medications  ketorolac (TORADOL) 30 MG/ML injection 30 mg (not administered)    Discharge Medication List as of 06/22/2014  8:53 PM    START taking these medications   Details  naproxen (NAPROSYN) 375 MG tablet Take 1 tablet (375 mg total) by mouth 2 (two) times daily with a meal., Starting 06/22/2014, Until Discontinued, Print    ondansetron (ZOFRAN) 4 MG tablet Take 1 tablet (4 mg total) by mouth every 8 (eight) hours as needed for nausea or vomiting., Starting 06/22/2014, Until Discontinued, Print         Purvis Sheffield, MD 06/22/14 2353

## 2014-06-22 NOTE — Discharge Instructions (Signed)
Chest Pain (Nonspecific) °It is often hard to give a specific diagnosis for the cause of chest pain. There is always a chance that your pain could be related to something serious, such as a heart attack or a blood clot in the lungs. You need to follow up with your health care provider for further evaluation. °CAUSES  °· Heartburn. °· Pneumonia or bronchitis. °· Anxiety or stress. °· Inflammation around your heart (pericarditis) or lung (pleuritis or pleurisy). °· A blood clot in the lung. °· A collapsed lung (pneumothorax). It can develop suddenly on its own (spontaneous pneumothorax) or from trauma to the chest. °· Shingles infection (herpes zoster virus). °The chest wall is composed of bones, muscles, and cartilage. Any of these can be the source of the pain. °· The bones can be bruised by injury. °· The muscles or cartilage can be strained by coughing or overwork. °· The cartilage can be affected by inflammation and become sore (costochondritis). °DIAGNOSIS  °Lab tests or other studies may be needed to find the cause of your pain. Your health care provider may have you take a test called an ambulatory electrocardiogram (ECG). An ECG records your heartbeat patterns over a 24-hour period. You may also have other tests, such as: °· Transthoracic echocardiogram (TTE). During echocardiography, sound waves are used to evaluate how blood flows through your heart. °· Transesophageal echocardiogram (TEE). °· Cardiac monitoring. This allows your health care provider to monitor your heart rate and rhythm in real time. °· Holter monitor. This is a portable device that records your heartbeat and can help diagnose heart arrhythmias. It allows your health care provider to track your heart activity for several days, if needed. °· Stress tests by exercise or by giving medicine that makes the heart beat faster. °TREATMENT  °· Treatment depends on what may be causing your chest pain. Treatment may include: °· Acid blockers for  heartburn. °· Anti-inflammatory medicine. °· Pain medicine for inflammatory conditions. °· Antibiotics if an infection is present. °· You may be advised to change lifestyle habits. This includes stopping smoking and avoiding alcohol, caffeine, and chocolate. °· You may be advised to keep your head raised (elevated) when sleeping. This reduces the chance of acid going backward from your stomach into your esophagus. °Most of the time, nonspecific chest pain will improve within 2-3 days with rest and mild pain medicine.  °HOME CARE INSTRUCTIONS  °· If antibiotics were prescribed, take them as directed. Finish them even if you start to feel better. °· For the next few days, avoid physical activities that bring on chest pain. Continue physical activities as directed. °· Do not use any tobacco products, including cigarettes, chewing tobacco, or electronic cigarettes. °· Avoid drinking alcohol. °· Only take medicine as directed by your health care provider. °· Follow your health care provider's suggestions for further testing if your chest pain does not go away. °· Keep any follow-up appointments you made. If you do not go to an appointment, you could develop lasting (chronic) problems with pain. If there is any problem keeping an appointment, call to reschedule. °SEEK MEDICAL CARE IF:  °· Your chest pain does not go away, even after treatment. °· You have a rash with blisters on your chest. °· You have a fever. °SEEK IMMEDIATE MEDICAL CARE IF:  °· You have increased chest pain or pain that spreads to your arm, neck, jaw, back, or abdomen. °· You have shortness of breath. °· You have an increasing cough, or you cough   up blood.  You have severe back or abdominal pain.  You feel nauseous or vomit.  You have severe weakness.  You faint.  You have chills. This is an emergency. Do not wait to see if the pain will go away. Get medical help at once. Call your local emergency services (911 in U.S.). Do not drive  yourself to the hospital. MAKE SURE YOU:   Understand these instructions.  Will watch your condition.  Will get help right away if you are not doing well or get worse. Document Released: 02/19/2005 Document Revised: 05/17/2013 Document Reviewed: 12/16/2007 [facility] Patient Information 2015 [place], [place]. This information is not intended to replace advice given to you by your health care provider. Make sure you discuss any questions you have with your health care provider.  Abdominal Pain, Women Abdominal (stomach, pelvic, or belly) pain can be caused by many things. It is important to tell your doctor:  The location of the pain.  Does it come and go or is it present all the time?  Are there things that start the pain (eating certain foods, exercise)?  Are there other symptoms associated with the pain (fever, nausea, vomiting, diarrhea)? All of this is helpful to know when trying to find the cause of the pain. CAUSES   Stomach: virus or bacteria infection, or ulcer.  Intestine: appendicitis (inflamed appendix), regional ileitis (Crohn's disease), ulcerative colitis (inflamed colon), irritable bowel syndrome, diverticulitis (inflamed diverticulum of the colon), or cancer of the stomach or intestine.  Gallbladder disease or stones in the gallbladder.  Kidney disease, kidney stones, or infection.  Pancreas infection or cancer.  Fibromyalgia (pain disorder).  Diseases of the female organs:  Uterus: fibroid (non-cancerous) tumors or infection.  Fallopian tubes: infection or tubal pregnancy.  Ovary: cysts or tumors.  Pelvic adhesions (scar tissue).  Endometriosis (uterus lining tissue growing in the pelvis and on the pelvic organs).  Pelvic congestion syndrome (female organs filling up with blood just before the menstrual period).  Pain with the menstrual period.  Pain with ovulation (producing an egg).  Pain with an IUD (intrauterine device, birth control) in the  uterus.  Cancer of the female organs.  Functional pain (pain not caused by a disease, may improve without treatment).  Psychological pain.  Depression. DIAGNOSIS  Your doctor will decide the seriousness of your pain by doing an examination.  Blood tests.  X-rays.  Ultrasound.  CT scan (computed tomography, special type of X-ray).  MRI (magnetic resonance imaging).  Cultures, for infection.  Barium enema (dye inserted in the large intestine, to better view it with X-rays).  Colonoscopy (looking in intestine with a lighted tube).  Laparoscopy (minor surgery, looking in abdomen with a lighted tube).  Major abdominal exploratory surgery (looking in abdomen with a large incision). TREATMENT  The treatment will depend on the cause of the pain.   Many cases can be observed and treated at home.  Over-the-counter medicines recommended by your caregiver.  Prescription medicine.  Antibiotics, for infection.  Birth control pills, for painful periods or for ovulation pain.  Hormone treatment, for endometriosis.  Nerve blocking injections.  Physical therapy.  Antidepressants.  Counseling with a psychologist or psychiatrist.  Minor or major surgery. HOME CARE INSTRUCTIONS   Do not take laxatives, unless directed by your caregiver.  Take over-the-counter pain medicine only if ordered by your caregiver. Do not take aspirin because it can cause an upset stomach or bleeding.  Try a clear liquid diet (broth or water) as  ordered by your caregiver. Slowly move to a bland diet, as tolerated, if the pain is related to the stomach or intestine.  Have a thermometer and take your temperature several times a day, and record it.  Bed rest and sleep, if it helps the pain.  Avoid sexual intercourse, if it causes pain.  Avoid stressful situations.  Keep your follow-up appointments and tests, as your caregiver orders.  If the pain does not go away with medicine or surgery, you  may try:  Acupuncture.  Relaxation exercises (yoga, meditation).  Group therapy.  Counseling. SEEK MEDICAL CARE IF:   You notice certain foods cause stomach pain.  Your home care treatment is not helping your pain.  You need stronger pain medicine.  You want your IUD removed.  You feel faint or lightheaded.  You develop nausea and vomiting.  You develop a rash.  You are having side effects or an allergy to your medicine. SEEK IMMEDIATE MEDICAL CARE IF:   Your pain does not go away or gets worse.  You have a fever.  Your pain is felt only in portions of the abdomen. The right side could possibly be appendicitis. The left lower portion of the abdomen could be colitis or diverticulitis.  You are passing blood in your stools (bright red or black tarry stools, with or without vomiting).  You have blood in your urine.  You develop chills, with or without a fever.  You pass out. MAKE SURE YOU:   Understand these instructions.  Will watch your condition.  Will get help right away if you are not doing well or get worse. Document Released: 03/09/2007 Document Revised: 09/26/2013 Document Reviewed: 03/29/2009 [facility] Patient Information 2015 [place], [place]. This information is not intended to replace advice given to you by your health care provider. Make sure you discuss any questions you have with your health care provider.  Emergency Department Resource Guide 1) Find a Doctor and Pay Out of Pocket Although you won't have to find out who is covered by your insurance plan, it is a good idea to ask around and get recommendations. You will then need to call the office and see if the doctor you have chosen will accept you as a new patient and what types of options they offer for patients who are self-pay. Some doctors offer discounts or will set up payment plans for their patients who do not have insurance, but you will need to ask so you aren't surprised when you get to your  appointment.  2) Contact Your Local Health Department Not all health departments have doctors that can see patients for sick visits, but many do, so it is worth a call to see if yours does. If you don't know where your local health department is, you can check in your phone book. The CDC also has a tool to help you locate your state's health department, and many state websites also have listings of all of their local health departments.  3) Find a Walk-in Clinic If your illness is not likely to be very severe or complicated, you may want to try a walk in clinic. These are popping up all over the country in pharmacies, drugstores, and shopping centers. They're usually staffed by nurse practitioners or physician assistants that have been trained to treat common illnesses and complaints. They're usually fairly quick and inexpensive. However, if you have serious medical issues or chronic medical problems, these are probably not your best option.  No Primary Care Doctor: -  Call Health Connect at  [phone] - they can help you locate a primary care doctor that  accepts your insurance, provides certain services, etc. - Physician Referral Service- [phone]  Chronic Pain Problems: Organization         Address  Phone   Notes  [facility]  [phone]) [phone] Patients need to be referred by their primary care doctor.   Medication Assistance: Organization         Address  Phone   Notes  [facility] Medication [facility] [address] [place], [place] [id number] [phone]) [phone] --Must be a resident of [facility] [place] -- Must have NO insurance coverage whatsoever (no Medicaid/ Medicare, etc.) -- The pt. MUST have a primary care doctor that directs their care regularly and follows them in the community   Med[facility]ist  [phone]) [phone]   [employer]  [phone]    Agencies that provide inexpensive medical care: Organization         Address  Phone   Notes  [facility]  [phone]   [facility]    [phone]   [facility] [address] [place], [place] [address] [phone]) [phone]   [facility] [place] [address], [place] [phone]   Planned Parenthood    [phone]   [facility]    [phone]) [phone]   Community Health and [facility]  [address], [place] Phone:  [phone](336) [phone], Fax:  [phone](336) [phone] Hours of Operation:  9 am - 6 pm, M-F.  Also accepts Medicaid/Medicare and self-pay.  [facility]  [address], [place] Phone: [phone](336) [phone], Fax: [phone](336) [phone]. Hours of Operation:  8:30 am - 5:30 pm, M-F.  Also accepts Medicaid and self-pay.  [facility] [place] [address], [place] Phone: [phone]) [phone]   [facility] [address], [place], [place] [phone], Ext. [phone] Mondays & Thursdays: 7-9 AM.  First 15 patients are seen on a first come, first serve basis.    Medicaid-accepting [facility] Providers:  Organization         Address  Phone   Notes  [facility] [address],  [phone]) [phone] Also accepts self-pay patients.  [facility] [address] [place], [place]  [phone]) [phone]   [facility] [address], [place] [phone]) [phone]   [facility] [address] [place] [address], [place] [phone]) [phone]   Renaye RakersVeita Bland [address], [place]   [phone] Only accepts [place] Access [place] patients after they have their name applied to their card.   Self-Pay (no insurance) in [facility]:  Organization         Address  Phone   Notes  Sickle Cell Patients, [facility] [address], [place] [phone]   [facility] [address], [place] [phone](336) [phone]   [facility]  [place]  [address], [place] [phone]) [phone]   [facility]/Dr. Osei-Bonsu  [address] [place] [address], [place] or [phone], [place] [phone]) [phone] Phone number for both [place] and [place] locations is the same.  [facility] [address], [place] [phone]   [facility] [place]  [address], [place] or [address] [phone]   [facility] [address] [place], [place] [phone], phone; [phone], fax Sees patients 1st and 3rd Saturday of every month.  Must not qualify for public or private insurance (i.e. Medicaid, Medicare, [place] Health Choice, Veterans' Benefits)  Household income should be no more than 200% of the poverty level The clinic cannot treat you if you are pregnant or think you are pregnant  Sexually transmitted diseases are not treated at the clinic.    Dental Care: Organization         Address  Phone  Notes  [facility] Department of [facility] [address] [place], [place] [phone] Accepts children up to age 55 who are enrolled in [place] or [place] Health Choice; pregnant women with a Medicaid card; and children who have applied for Medicaid or [place] Health Choice, but were declined, whose parents can pay a reduced fee at time of service.  [facility] of [facility]  [address], [place] [phone] Accepts children up to age 63 who are enrolled in [place] or [place] Health Choice; pregnant women with a Medicaid card; and children who have applied for Medicaid or [place] Health Choice, but were declined, whose parents can pay a reduced fee at time of service.  [facility]  [address] [place], [place] [phone] Patients are seen by appointment only. Walk-ins are not accepted. [facility] will see patients 41 years of age and  older. Monday - Tuesday (8am-5pm) Most Wednesdays (8:30-5pm) $30 per visit, cash only  [facility] Access PRO[facility]  [address], [facility] [phone] Patients are seen by appointment only. Walk-ins are not accepted. [facility] will see patients 46 years of age and older. One Wednesday Evening (Monthly: Volunteer Based).  $30 per visit, cash only  [employer]  [phone] for adults; Children under age 21, call Graduate Pediatric Dentistry at [phone]. Children aged 65-14, please call [phone] to request a pediatric application.  Dental services are provided in all areas of dental care including fillings, crowns and bridges, complete and partial dentures, implants, gum treatment, root canals, and extractions. Preventive care is also provided. Treatment is provided to both adults and children. Patients are selected via a lottery and there is often a waiting list.   [facility] [address] [place]  [phone] [email or web address]   [facility] [address] [place], [place] [phone], Ext. [phone] Second and Fourth Thursday of each month, opens at 6:30 AM; Clinic ends at 9 AM.  Patients are seen on a first-come first-served basis, and a limited number are seen during each clinic.   [facility] [place]  [address], [place] [phone]   Eligibility Requirements You must have lived in [place], [place], or [place] counties for at least the last three months.   You cannot be eligible for state or federal sponsored [place], including [employer], [place], or [employer].   You generally cannot be eligible for healthcare insurance through your employer.    How to apply: Eligibility screenings are held every Tuesday and Wednesday afternoon from 1:00 pm until 4:00 pm. You do not need an appointment for the interview!  [facility] [address]  [place], [place] [phone]   [facility]  Department  [phone]   [facility] Health Department  [phone]   [facility] Health Department  [phone]    Behavioral Health Resources in the Community: Intensive Outpatient Programs Organization         Address  Phone  Notes  [facility] [address], [place], [place] [phone]   [facility]patient [address] [place], [place] [phone]   [facility] [address], [place], [place]  [phone]   [facility] [address],  [place], [place] [phone] or [phone]   Substance Abuse Resources Organization         Address  Phone  Notes  [facility] Drug Services  [phone]   [facility]  [phone]   [facility]  [phone]   Floydene Flock  [phone]   Residential & Outpatient Substance Abuse Program  [phone]   Psychological Services Organization         Address  Phone  Notes  [facility]  [phone]   [facility]  [phone]   [facility] [address] [place] [phone] or [phone]    Mobile Crisis Teams Organization         Address  Phone  Notes  [facility], Mobile Crisis Care Unit  [phone]   [facility]  [address] [place], [place] [phone]   Doristine Locks [address] [place] [phone]    Self-Help/Support Groups Organization         Address  Phone             Notes  [facility] [place] - variety of support groups  [phone]- I7437963 Call for more information  Narcotics Anonymous (NA), [facility] [address], [employer] [place]  2 meetings at this location   Statistician         Address  Phone  Notes  [facility] [address],    [place]  [phone]   [facility]  [address], [place] [address], [place], [place] [phone]   [facility] [address] [place], [place] [phone] Admissions: 8am-3pm M-F  [facility] [address]    [place], [place] [phone]   [facility] [address] [place], [place], [place] [phone]   [facility] [address]  [place], [place] [phone]   [facility] - Intensive Outpatient [address], [place], [place] [phone]   [facility] ([facility]iction [facility] [address] [place].,  [place], [place] [phone] or [phone]   [facility] ([facility]) [address] [place], [place] [phone] Accepts Medicaid  [facility] [place] [address],  [place] [phone] Substance Abuse/Addiction Treatment   [facility] Organization         Address  Phone  Notes  [facility]  [phone]   Angie Fava, PhD [address], [place]   [phone] or [phone]   [facility]   [address] [place], [place] [phone]   [facility] [address] [place], [place] [phone] Insurance/Medicaid/sponsorship through [employer] and Families [address]  [place], [place] [phone] [place], [place] [phone]    Dr. Adele Schilder  [phone]   [facility] Dept. 1) [address] [place] 2) [place] 3)  [address], [place] [phone]  [phone]   [place] [phone] or [phone] (After Hours)

## 2014-06-22 NOTE — ED Notes (Signed)
Pt here reporting pressure to multiple spots around body but mainly to chest and abdomen.  Sts it feels like "I'm contracting all over".  Associates shortness of breath, nausea, vomiting and being diaphoretic with pain.  Sts she has been throwing up for about 5 weeks.

## 2017-10-01 ENCOUNTER — Encounter (HOSPITAL_COMMUNITY): Payer: Self-pay | Admitting: Emergency Medicine

## 2017-10-01 ENCOUNTER — Emergency Department (HOSPITAL_COMMUNITY)
Admission: EM | Admit: 2017-10-01 | Discharge: 2017-10-01 | Disposition: A | Discharge status: Home / Self Care | Payer: Medicaid Other | Attending: Emergency Medicine | Admitting: Emergency Medicine

## 2017-10-01 ENCOUNTER — Emergency Department (HOSPITAL_COMMUNITY): Payer: Medicaid Other

## 2017-10-01 ENCOUNTER — Other Ambulatory Visit: Payer: Self-pay

## 2017-10-01 DIAGNOSIS — F17210 Nicotine dependence, cigarettes, uncomplicated: Secondary | ICD-10-CM | POA: Diagnosis not present

## 2017-10-01 DIAGNOSIS — Z79899 Other long term (current) drug therapy: Secondary | ICD-10-CM | POA: Diagnosis not present

## 2017-10-01 DIAGNOSIS — R42 Dizziness and giddiness: Secondary | ICD-10-CM

## 2017-10-01 DIAGNOSIS — F410 Panic disorder [episodic paroxysmal anxiety] without agoraphobia: Secondary | ICD-10-CM | POA: Diagnosis not present

## 2017-10-01 LAB — I-STAT TROPONIN, ED: TROPONIN I, POC: 0 ng/mL (ref 0.00–0.08)

## 2017-10-01 LAB — COMPREHENSIVE METABOLIC PANEL
ALT: 12 U/L — AB (ref 14–54)
AST: 17 U/L (ref 15–41)
Albumin: 4.2 g/dL (ref 3.5–5.0)
Alkaline Phosphatase: 43 U/L (ref 38–126)
Anion gap: 11 (ref 5–15)
BUN: 6 mg/dL (ref 6–20)
CALCIUM: 9.1 mg/dL (ref 8.9–10.3)
CHLORIDE: 100 mmol/L — AB (ref 101–111)
CO2: 27 mmol/L (ref 22–32)
CREATININE: 0.67 mg/dL (ref 0.44–1.00)
Glucose, Bld: 79 mg/dL (ref 65–99)
Potassium: 3.7 mmol/L (ref 3.5–5.1)
Sodium: 138 mmol/L (ref 135–145)
TOTAL PROTEIN: 7 g/dL (ref 6.5–8.1)
Total Bilirubin: 0.5 mg/dL (ref 0.3–1.2)

## 2017-10-01 LAB — CBC
HEMATOCRIT: 36.9 % (ref 36.0–46.0)
Hemoglobin: 12.6 g/dL (ref 12.0–15.0)
MCH: 31.8 pg (ref 26.0–34.0)
MCHC: 34.1 g/dL (ref 30.0–36.0)
MCV: 93.2 fL (ref 78.0–100.0)
Platelets: 185 10*3/uL (ref 150–400)
RBC: 3.96 MIL/uL (ref 3.87–5.11)
RDW: 12.2 % (ref 11.5–15.5)
WBC: 5.6 10*3/uL (ref 4.0–10.5)

## 2017-10-01 LAB — DIFFERENTIAL
BASOS PCT: 0 %
Basophils Absolute: 0 10*3/uL (ref 0.0–0.1)
Eosinophils Absolute: 0.2 10*3/uL (ref 0.0–0.7)
Eosinophils Relative: 3 %
Lymphocytes Relative: 41 %
Lymphs Abs: 2.3 10*3/uL (ref 0.7–4.0)
MONO ABS: 0.3 10*3/uL (ref 0.1–1.0)
MONOS PCT: 6 %
NEUTROS ABS: 2.9 10*3/uL (ref 1.7–7.7)
Neutrophils Relative %: 50 %

## 2017-10-01 LAB — I-STAT BETA HCG BLOOD, ED (MC, WL, AP ONLY): I-stat hCG, quantitative: <5 m[IU]/mL (ref <5)

## 2017-10-01 LAB — PROTIME-INR
INR: 1.07
Prothrombin Time: 13.8 seconds (ref 11.4–15.2)

## 2017-10-01 LAB — APTT: aPTT: 29 seconds (ref 24–36)

## 2017-10-01 NOTE — ED Notes (Signed)
Patient is resting with call bell in reach and family at bedside 

## 2017-10-01 NOTE — Discharge Instructions (Signed)
Your symptoms may be due to having panic attacks.  Please follow up closely with your primary care provider for further management.  Your head CT scan and your labs today are within normal limits.
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
## 2017-10-01 NOTE — ED Triage Notes (Signed)
Patient reports multiple head injuries over the last few months, last injury March 21 when she tripped and hit her face on the ground. Since that fall, patient reports intermittent memory loss and confusion. Ambulatory, alert and oriented and in no apparent distress at this time.

## 2017-10-15 ENCOUNTER — Encounter: Payer: Self-pay | Admitting: Neurology

## 2017-12-30 ENCOUNTER — Encounter: Payer: Self-pay | Admitting: Neurology

## 2017-12-30 ENCOUNTER — Encounter

## 2017-12-30 ENCOUNTER — Ambulatory Visit: Payer: Medicaid Other | Admitting: Neurology

## 2017-12-30 VITALS — Ht 59.0 in | Wt 106.0 lb

## 2017-12-30 DIAGNOSIS — Z789 Other specified health status: Secondary | ICD-10-CM | POA: Diagnosis not present

## 2017-12-30 DIAGNOSIS — F17200 Nicotine dependence, unspecified, uncomplicated: Secondary | ICD-10-CM | POA: Diagnosis not present

## 2017-12-30 DIAGNOSIS — R55 Syncope and collapse: Secondary | ICD-10-CM

## 2017-12-30 DIAGNOSIS — Z7289 Other problems related to lifestyle: Secondary | ICD-10-CM

## 2017-12-30 DIAGNOSIS — F419 Anxiety disorder, unspecified: Secondary | ICD-10-CM | POA: Diagnosis not present
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
## 2017-12-30 NOTE — Addendum Note (Signed)
Addended by: Dorthy CoolerBURNS, SANDRA J on: 12/30/2017 01:36 PM   Modules accepted: Orders

## 2018-01-29 ENCOUNTER — Encounter: Payer: Self-pay | Admitting: *Deleted

## 2018-02-17 ENCOUNTER — Ambulatory Visit (INDEPENDENT_AMBULATORY_CARE_PROVIDER_SITE_OTHER): Payer: Medicaid Other | Admitting: Obstetrics & Gynecology

## 2018-02-17 ENCOUNTER — Encounter: Payer: Self-pay | Admitting: Obstetrics & Gynecology

## 2018-02-17 ENCOUNTER — Other Ambulatory Visit (HOSPITAL_COMMUNITY)
Admission: RE | Admit: 2018-02-17 | Discharge: 2018-02-17 | Disposition: A | Discharge status: Home / Self Care | Payer: Medicaid Other | Source: Ambulatory Visit | Attending: Obstetrics & Gynecology | Admitting: Obstetrics & Gynecology

## 2018-02-17 VITALS — BP 126/85 | HR 60 | Ht 59.0 in | Wt 107.5 lb

## 2018-02-17 DIAGNOSIS — D069 Carcinoma in situ of cervix, unspecified: Secondary | ICD-10-CM | POA: Insufficient documentation

## 2018-02-17 LAB — POCT PREGNANCY, URINE: PREG TEST UR: NEGATIVE

## 2018-02-17 NOTE — Progress Notes (Signed)
   GYNECOLOGY OFFICE PROCEDURE NOTE  Cheyenne Kelly is a 27 y.o. [id number] here for LEEP. No GYN concerns. Pap smear and colposcopy history reviewed.    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  Role of HPV,cervical dysplasia and need for close followup was empasized. Informed written consent was obtained. All questions were answered. Time out performed. Urine pregnancy test was negative.  Procedure: The patient was placed in lithotomy position and the bivalved coated speculum was placed in the patient's vagina. A grounding pad placed on the patient. Lugol's solution was applied to the cervix and areas of decreased uptake were noted around the transformation zone.   Local anesthesia was administered via an intracervical block using 10 ml of 2% Lidocaine with epinephrine. The suction was turned on and the Large 1X Fisher Cone Biopsy Excisor on 3860 Watts of blended current was used to excise the area of decreased uptake and excise the entire transformation zone. Excellent hemostasis was achieved using roller ball coagulation set at 60 Watts coagulation current. Monsel's solution was then applied and the speculum was removed from the vagina. Specimens were sent to pathology.  The patient tolerated the procedure well. Post-operative instructions given to patient, including instruction to seek medical attention for persistent bright red bleeding, fever, abdominal/pelvic pain, dysuria, nausea or vomiting. She was also told about the possibility of having copious yellow to black tinged discharge for weeks. She was counseled to avoid anything in the vagina  (sex/douching/tampons) for 3 weeks. She has a 4 week post-operative check to assess wound healing, review results and discuss further management.    Jaynie CollinsUGONNA  [name], MD, FACOG Obstetrician & Gynecologist, [facility] for [facility], [facility]

## 2018-02-17 NOTE — Patient Instructions (Signed)
LEEP POST-PROCEDURE INSTRUCTIONS  1. You may take Ibuprofen, Aleve or Tylenol for pain if needed.  Cramping is normal.  2. You will have black and/or bloody discharge at first.  This will lighten and then turn clear before completely resolving.  This will take 2 to 3 weeks.  3. Put nothing in your vagina until the bleeding or discharge stops (usually 3 weeks).  4. You need to call if you have redness around the biopsy site, if there is any unusual draining, if the bleeding is heavy, or if you are concerned.  5. Shower or bathe as normal  6. We will call you within one week with results or we will discuss the results at your follow-up appointment if needed.  7. You will need to return for a follow-up Pap smear as directed by your physician. 

## 2018-02-19 ENCOUNTER — Telehealth: Payer: Self-pay | Admitting: *Deleted

## 2018-02-19 NOTE — Telephone Encounter (Signed)
Called pt to inform her of the results from her LEEP and that she needed to come back in a year for another Pap.  Pt verbalized understanding.

## 2018-02-19 NOTE — Telephone Encounter (Signed)
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 Please call to inform patient of results and recommendations.

## 2018-03-03 ENCOUNTER — Telehealth: Payer: Self-pay | Admitting: *Deleted

## 2018-03-03 NOTE — Telephone Encounter (Signed)
Pt called to report heavy bleeding with blood clots and pain follow her LEEP.  Pt reports she has been taking her birth control pills and is not due to start her cycle.  Pt wants to know if it is OK

## 2018-03-04 NOTE — Telephone Encounter (Signed)
Called patient and she states her bleeding started heavy with passing clots on Monday but has slowed down today. Patient reports she is on birth control pills and was supposed to be on her "off week" the week of the procedure but she started a new pack that week instead of taking the placebo pills. Discussed with patient bleeding wouldn't be related to the procedure because of when it occurred and is likely related to her hormones getting out of balance by changing up the pill cycle. Recommended she continue the pill pack normally. Patient verbalized understanding & had no questions.

## 2018-03-19 ENCOUNTER — Ambulatory Visit (INDEPENDENT_AMBULATORY_CARE_PROVIDER_SITE_OTHER): Payer: Medicaid Other | Admitting: Obstetrics & Gynecology

## 2018-03-19 ENCOUNTER — Encounter: Payer: Self-pay | Admitting: Obstetrics & Gynecology

## 2018-03-19 ENCOUNTER — Other Ambulatory Visit (HOSPITAL_COMMUNITY)
Admission: RE | Admit: 2018-03-19 | Discharge: 2018-03-19 | Disposition: A | Discharge status: Home / Self Care | Payer: Medicaid Other | Source: Ambulatory Visit | Attending: Obstetrics & Gynecology | Admitting: Obstetrics & Gynecology

## 2018-03-19 VITALS — BP 124/82 | HR 71 | Ht 59.0 in | Wt 105.8 lb

## 2018-03-19 DIAGNOSIS — D069 Carcinoma in situ of cervix, unspecified: Secondary | ICD-10-CM

## 2018-03-19 DIAGNOSIS — N898 Other specified noninflammatory disorders of vagina: Secondary | ICD-10-CM | POA: Diagnosis not present

## 2018-03-19 DIAGNOSIS — Z9889 Other specified postprocedural states: Secondary | ICD-10-CM

## 2018-03-19 NOTE — Progress Notes (Signed)
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 Stopped having spotting a few days ago, denies any abnormal discharge. Eating a regular diet without difficulty. Bowel movements are normal. The patient is not having any pain.  The following portions of the patient's history were reviewed and updated as appropriate: allergies, current medications, past family history, past medical history, past social history, past surgical history and problem list.  Review of Systems Pertinent items noted in HPI and remainder of comprehensive ROS otherwise negative.    Objective:    BP 124/82   Pulse 71   Ht 4\' 11"  (1.499 m)   Wt 105 lb 12.8 oz (48 kg)   LMP 03/09/2018 (Exact Date)   BMI 21.37 kg/m  General:  alert and no distress  Abdomen: soft, bowel sounds active, non-tender  Pelvic:   well-healed LEEP site, no erythema, moderate yellow discharge seen in vagina, testing sample obtained.    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 Please call to inform patient of results and recommendations.   Assessment:    Doing well postoperatively. Operative findings again reviewed. Pathology report discussed. has abnormal vaginal discharge   Plan:   Will follow up results of cervicovaginal ancillary and manage accordingly. Repeat cotesting needed in 12 and 24 months.  Routine preventative health maintenance measures emphasized.    [name], MD, FACOG Obstetrician & Gynecologist, [facility] for [employer], [facility]

## 2018-03-23 LAB — CERVICOVAGINAL ANCILLARY ONLY
Bacterial vaginitis: NEGATIVE
Candida vaginitis: NEGATIVE
Chlamydia: NEGATIVE
NEISSERIA GONORRHEA: NEGATIVE
Trichomonas: NEGATIVE

## 2018-10-13 ENCOUNTER — Other Ambulatory Visit: Payer: Self-pay | Admitting: Otolaryngology

## 2018-10-13 DIAGNOSIS — H9311 Tinnitus, right ear: Secondary | ICD-10-CM

## 2018-10-13 DIAGNOSIS — H9201 Otalgia, right ear: Secondary | ICD-10-CM

## 2018-10-28 ENCOUNTER — Ambulatory Visit
Admission: RE | Admit: 2018-10-28 | Discharge: 2018-10-28 | Disposition: A | Discharge status: Home / Self Care | Payer: Medicaid Other | Source: Ambulatory Visit | Attending: Otolaryngology | Admitting: Otolaryngology

## 2018-10-28 ENCOUNTER — Other Ambulatory Visit: Payer: Self-pay

## 2018-10-28 DIAGNOSIS — H9311 Tinnitus, right ear: Secondary | ICD-10-CM

## 2018-10-28 DIAGNOSIS — H9201 Otalgia, right ear: Secondary | ICD-10-CM

## 2018-10-28 MED ORDER — GADOBENATE DIMEGLUMINE 529 MG/ML IV SOLN
10.0000 mL | Freq: Once | INTRAVENOUS | Status: AC | PRN
  Administered 2018-10-28: 10 mL via INTRAVENOUS

## 2018-11-04 ENCOUNTER — Telehealth: Payer: Self-pay | Admitting: Diagnostic Neuroimaging

## 2018-11-04 ENCOUNTER — Encounter: Payer: Self-pay | Admitting: Diagnostic Neuroimaging

## 2018-11-04 NOTE — Telephone Encounter (Signed)
EMR updated with referral papers dated 10/06/18.

## 2018-11-04 NOTE — Addendum Note (Signed)
Addended by: Florian Buff C on: 11/04/2018 12:50 PM   Modules accepted: Orders

## 2018-11-04 NOTE — Telephone Encounter (Signed)
°  Due to current COVID 19 pandemic, our office is severely reducing in office visits until further notice, in order to minimize the risk to our patients and healthcare providers.    Called patient and scheduled a virtual visit with Dr. Leta Baptist for 6/15. Patient verbalized understanding of the [email or web address] process and I have sent an e-mail to [email or web address] with link and instructions as well as my name and our office number. Patient understands that they will receive a call from RN to update chart.   Pt understands that although there may be some limitations with this type of visit, we will take all precautions to reduce any security or privacy concerns.  Pt understands that this will be treated like an in office visit and we will file with pt's insurance, and there may be a patient responsible charge related to this service.

## 2018-11-08 ENCOUNTER — Other Ambulatory Visit: Payer: Self-pay

## 2018-11-08 ENCOUNTER — Ambulatory Visit (INDEPENDENT_AMBULATORY_CARE_PROVIDER_SITE_OTHER): Payer: Medicaid Other | Admitting: Diagnostic Neuroimaging

## 2018-11-08 DIAGNOSIS — R51 Headache: Secondary | ICD-10-CM | POA: Diagnosis not present

## 2018-11-08 DIAGNOSIS — R519 Headache, unspecified: Secondary | ICD-10-CM

## 2018-11-08 DIAGNOSIS — H9203 Otalgia, bilateral: Secondary | ICD-10-CM

## 2018-11-08 DIAGNOSIS — F4310 Post-traumatic stress disorder, unspecified: Secondary | ICD-10-CM | POA: Diagnosis not present

## 2018-11-08 MED ORDER — GABAPENTIN 100 MG PO CAPS: 100.0000 mg | ORAL_CAPSULE | Freq: Three times a day (TID) | ORAL | 3 refills | Status: AC
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
## 2018-11-25 ENCOUNTER — Telehealth: Payer: Self-pay | Admitting: *Deleted

## 2018-11-25 NOTE — Telephone Encounter (Signed)
Spoke with patient and schedule her 3 month FU.

## 2019-02-21 ENCOUNTER — Ambulatory Visit: Payer: Self-pay | Admitting: Diagnostic Neuroimaging

## 2019-02-21 ENCOUNTER — Telehealth: Payer: Self-pay | Admitting: *Deleted

## 2019-02-21 NOTE — Telephone Encounter (Signed)
Patient was no show for follow up today. 

## 2019-02-22 ENCOUNTER — Encounter: Payer: Self-pay | Admitting: Diagnostic Neuroimaging

## 2019-03-31 ENCOUNTER — Other Ambulatory Visit: Payer: Self-pay | Admitting: Diagnostic Neuroimaging

## 2019-04-27 ENCOUNTER — Other Ambulatory Visit: Payer: Self-pay

## 2019-04-27 ENCOUNTER — Ambulatory Visit (HOSPITAL_COMMUNITY)
Admission: EM | Admit: 2019-04-27 | Discharge: 2019-04-27 | Disposition: A | Discharge status: Home / Self Care | Payer: Medicaid Other | Attending: Emergency Medicine | Admitting: Emergency Medicine

## 2019-04-27 ENCOUNTER — Ambulatory Visit: Payer: Self-pay | Admitting: *Deleted

## 2019-04-27 ENCOUNTER — Encounter (HOSPITAL_COMMUNITY): Payer: Self-pay

## 2019-04-27 DIAGNOSIS — R102 Pelvic and perineal pain: Secondary | ICD-10-CM

## 2019-04-27 DIAGNOSIS — R55 Syncope and collapse: Secondary | ICD-10-CM | POA: Diagnosis not present

## 2019-04-27 DIAGNOSIS — Z3202 Encounter for pregnancy test, result negative: Secondary | ICD-10-CM

## 2019-04-27 DIAGNOSIS — M545 Low back pain, unspecified: Secondary | ICD-10-CM

## 2019-04-27 LAB — CBC
HCT: 39.8 % (ref 36.0–46.0)
Hemoglobin: 13.7 g/dL (ref 12.0–15.0)
MCH: 32.3 pg (ref 26.0–34.0)
MCHC: 34.4 g/dL (ref 30.0–36.0)
MCV: 93.9 fL (ref 80.0–100.0)
Platelets: 204 10*3/uL (ref 150–400)
RBC: 4.24 MIL/uL (ref 3.87–5.11)
RDW: 11.9 % (ref 11.5–15.5)
WBC: 5.6 10*3/uL (ref 4.0–10.5)
nRBC: 0 % (ref 0.0–0.2)

## 2019-04-27 LAB — POCT URINALYSIS DIP (DEVICE)
Bilirubin Urine: NEGATIVE
Glucose, UA: NEGATIVE mg/dL
Hgb urine dipstick: NEGATIVE
Ketones, ur: 40 mg/dL — AB
Leukocytes,Ua: NEGATIVE
Nitrite: NEGATIVE
Protein, ur: NEGATIVE mg/dL
Specific Gravity, Urine: 1.01 (ref 1.005–1.030)
Urobilinogen, UA: 0.2 mg/dL (ref 0.0–1.0)
pH: 6 (ref 5.0–8.0)

## 2019-04-27 LAB — POCT PREGNANCY, URINE: Preg Test, Ur: NEGATIVE

## 2019-04-27 LAB — POC URINE PREG, ED: Preg Test, Ur: NEGATIVE

## 2019-04-27 MED ORDER — AZITHROMYCIN 250 MG PO TABS
ORAL_TABLET | ORAL | Status: AC
  Filled 2019-04-27: qty 4

## 2019-04-27 MED ORDER — AZITHROMYCIN 250 MG PO TABS
1000.0000 mg | ORAL_TABLET | Freq: Once | ORAL | Status: AC
  Administered 2019-04-27: 16:00:00 1000 mg via ORAL

## 2019-04-27 MED ORDER — CEFTRIAXONE SODIUM 250 MG IJ SOLR
250.0000 mg | Freq: Once | INTRAMUSCULAR | Status: AC
  Administered 2019-04-27: 16:00:00 250 mg via INTRAMUSCULAR

## 2019-04-27 MED ORDER — CEFTRIAXONE SODIUM 250 MG IJ SOLR
INTRAMUSCULAR | Status: AC
  Filled 2019-04-27: qty 250

## 2019-04-27 MED ORDER — KURVELO 0.15-30 MG-MCG PO TABS: 1.0000 | ORAL_TABLET | Freq: Every day | ORAL | 0 refills | Status: AC

## 2019-04-27 NOTE — ED Notes (Signed)
Spoke with Tressia Miners, NP about patient .  Patient wants to stay at [facility].  Has a call in to her provider

## 2019-04-27 NOTE — Telephone Encounter (Signed)
  Patient calls with contraction type pains in her lower abdomen. Reports this began about 10 ago before the beginning of her latest cycle which was heavier and shorter than normal for her. Reports she passed out during her last  Reason for Disposition . Patient sounds very sick or weak to the triager  Answer Assessment - Initial Assessment Questions 1. LOCATION: "Where does it hurt?"      Lower abdomen above where the ovaries would sit. 2. RADIATION: "Does the pain shoot anywhere else?" (e.g., chest, back)     no 3. ONSET: "When did the pain begin?" (e.g., minutes, hours or days ago)      Intermittently over the last 10 days. 4. SUDDEN: "Gradual or sudden onset?"     sudden 5. PATTERN "Does the pain come and go, or is it constant?"    - If constant: "Is it getting better, staying the same, or worsening?"      (Note: Constant means the pain never goes away completely; most serious pain is constant and it progresses)     - If intermittent: "How long does it last?" "Do you have pain now?"     (Note: Intermittent means the pain goes away completely between bouts)     Comes and goes 6. SEVERITY: "How bad is the pain?"  (e.g., Scale 1-10; mild, moderate, or severe)   - MILD (1-3): doesn't interfere with normal activities, abdomen soft and not tender to touch    - MODERATE (4-7): interferes with normal activities or awakens from sleep, tender to touch    - SEVERE (8-10): excruciating pain, doubled over, unable to do any normal activities     7 today 7. RECURRENT SYMPTOM: "Have you ever had this type of abdominal pain before?" If so, ask: "When was the last time?" and "What happened that time?"      no 8. CAUSE: "What do you think is causing the abdominal pain?"     Unsure. 9. RELIEVING/AGGRAVATING FACTORS: "What makes it better or worse?" (e.g., movement, antacids, bowel movement)     Nothing in particular, tylenol has not helped. 10. OTHER SYMPTOMS: "Has there been any vomiting, diarrhea,  constipation, or urine problems?"      Urine has a strong odor, some nausea no vomiting. 11. PREGNANCY: "Is there any chance you are pregnant?" "When was your last menstrual period?"       Completed last period November 29  Protocols used: [place]

## 2019-04-27 NOTE — ED Triage Notes (Signed)
Pt states she has back pain and cramps. X 1 week. Pt states she has a vaginal odor and no discharge 2 months.

## 2019-04-27 NOTE — ED Provider Notes (Signed)
[facility]   MRN: [id number] DOB: [birth year]  Subjective:   Cheyenne Kelly is a 28 y.o. female presenting for 1 to 30-month history of persistent vaginal odor, pelvic cramping and intermittent mild to moderate low back pain. LMP ended ~3 days ago. In the last 4 months, her cycles have been very heavy, painful. Stopped taking OCP ~4-5 months ago due to not going back for follow-up with her gynecologist.  She has had LEEP procedure done as well this past year about 6 months ago.  Has a history of gonorrhea, chlamydia and states that her symptoms then were vaginal discharge.  Tries to hydrate very well.  Denies any kind of fever, nausea, vomiting, urinary symptoms, vaginal discharge, history of kidney stones.  Patient also reports that she has had syncopal episodes in the past month.  Has a history of this and has worked with a Insurance account manager.  Currently she is taking gabapentin.  Has not followed up with them.  No current facility-administered medications for this encounter.   Current Outpatient Medications:  .  cetirizine (ZYRTEC) 10 MG tablet, Take by mouth., Disp: , Rfl:  .  escitalopram (LEXAPRO) 20 MG tablet, TK 1 T PO D, Disp: , Rfl:  .  gabapentin (NEURONTIN) 100 MG capsule, Take 1 capsule (100 mg total) by mouth 3 (three) times daily., Disp: 90 capsule, Rfl: 3 .  KURVELO 0.15-30 MG-MCG tablet, TK 1 T PO D, Disp: , Rfl:     Allergies  Allergen Reactions  . Amoxicillin Rash    Past Medical History:  Diagnosis Date  . Anxiety   . Depression   . PTSD (post-traumatic stress disorder)   . Tinnitus   . Vertigo      Past Surgical History:  Procedure Laterality Date  . APPENDECTOMY      Family History  Problem Relation Age of Onset  . Rectal cancer Mother 81  . Drug abuse Father   . Bipolar disorder Sister   . Thyroid disease Sister   . Cancer Maternal Grandmother   . Diabetes Maternal Grandfather   . Hypertension Maternal Grandfather     Social History   Tobacco  Use  . Smoking status: Current Every Day Smoker    Packs/day: 1.00  . Smokeless tobacco: Never Used  Substance Use Topics  . Alcohol use: Yes    Alcohol/week: 12.0 standard drinks    Types: 12 Cans of beer per week    Comment: 3-6 beers 3-4x wk  . Drug use: No    ROS   Objective:   Vitals: BP 125/68 (BP Location: Right Arm)   Pulse 66   Temp 98.2 F (36.8 C) (Oral)   Resp 16   Wt 115 lb (52.2 kg)   LMP 04/21/2019   SpO2 100%   BMI 23.23 kg/m   Physical Exam Constitutional:      General: She is not in acute distress.    Appearance: Normal appearance. She is well-developed and normal weight. She is not ill-appearing, toxic-appearing or diaphoretic.  HENT:     Head: Normocephalic and atraumatic.     Right Ear: External ear normal.     Left Ear: External ear normal.     Nose: Nose normal.     Mouth/Throat:     Mouth: Mucous membranes are moist.     Pharynx: Oropharynx is clear.  Eyes:     General: No scleral icterus.       Right eye: No discharge.  Left eye: No discharge.     Extraocular Movements: Extraocular movements intact.     Pupils: Pupils are equal, round, and reactive to light.  Neck:     Musculoskeletal: Normal range of motion and neck supple.     Vascular: No carotid bruit.  Cardiovascular:     Rate and Rhythm: Normal rate and regular rhythm.     Heart sounds: Normal heart sounds. No murmur. No friction rub. No gallop.   Pulmonary:     Effort: Pulmonary effort is normal. No respiratory distress.     Breath sounds: Normal breath sounds. No stridor. No wheezing, rhonchi or rales.  Abdominal:     General: Bowel sounds are normal. There is no distension.     Palpations: Abdomen is soft. There is no mass.     Tenderness: There is abdominal tenderness (Generalized throughout). There is no right CVA tenderness, left CVA tenderness, guarding or rebound.  Musculoskeletal:     Lumbar back: She exhibits tenderness (Over area outlined). She exhibits  normal range of motion, no bony tenderness, no swelling, no edema, no deformity and no spasm.       Back:  Skin:    General: Skin is warm and dry.     Coloration: Skin is not pale.     Findings: No rash.  Neurological:     General: No focal deficit present.     Mental Status: She is alert and oriented to person, place, and time.     Cranial Nerves: No cranial nerve deficit.     Motor: No weakness.     Coordination: Coordination normal.     Gait: Gait normal.     Deep Tendon Reflexes: Reflexes normal.  Psychiatric:        Mood and Affect: Mood normal.        Behavior: Behavior normal.        Thought Content: Thought content normal.        Judgment: Judgment normal.     Results for orders placed or performed during the hospital encounter of 04/27/19 (from the past 24 hour(s))  POCT urinalysis dip (device)     Status: Abnormal   Collection Time: 04/27/19  3:08 PM  Result Value Ref Range   Glucose, UA NEGATIVE NEGATIVE mg/dL   Bilirubin Urine NEGATIVE NEGATIVE   Ketones, ur 40 (A) NEGATIVE mg/dL   Specific Gravity, Urine 1.010 1.005 - 1.030   Hgb urine dipstick NEGATIVE NEGATIVE   pH 6.0 5.0 - 8.0   Protein, ur NEGATIVE NEGATIVE mg/dL   Urobilinogen, UA 0.2 0.0 - 1.0 mg/dL   Nitrite NEGATIVE NEGATIVE   Leukocytes,Ua NEGATIVE NEGATIVE  POC urine pregnancy     Status: None   Collection Time: 04/27/19  3:14 PM  Result Value Ref Range   Preg Test, Ur NEGATIVE NEGATIVE  Pregnancy, urine POC     Status: None   Collection Time: 04/27/19  3:14 PM  Result Value Ref Range   Preg Test, Ur NEGATIVE NEGATIVE    Assessment and Plan :   1. Acute bilateral low back pain without sciatica   2. Pelvic cramping   3. Syncope, unspecified syncope type     We will treat empirically for gonorrhea and chlamydia patient's request.  She states that she recently took amoxicillin for dental procedure and did completely fine, did not have any allergic reaction.  Labs pending.  I refilled  patient's OCP medication given significant difficulty with her periods as of late.  Recommended she  follow-up with women's health for recheck and refills.  Patient is to follow-up with her neurologist for recheck on her syncopal episodes. Counseled patient on potential for adverse effects with medications prescribed/recommended today, ER and return-to-clinic precautions discussed, patient verbalized understanding.    Jaynee Eagles, PA-C 04/27/19 1540

## 2019-04-28 LAB — URINE CULTURE: Culture: NO GROWTH

## 2019-04-29 ENCOUNTER — Other Ambulatory Visit (HOSPITAL_COMMUNITY): Payer: Self-pay | Admitting: Urgent Care

## 2019-04-29 LAB — CERVICOVAGINAL ANCILLARY ONLY
Bacterial vaginitis: POSITIVE — AB
Candida vaginitis: NEGATIVE
Chlamydia: NEGATIVE
Neisseria Gonorrhea: NEGATIVE
Trichomonas: NEGATIVE

## 2019-04-29 MED ORDER — METRONIDAZOLE 500 MG PO TABS: 500.0000 mg | ORAL_TABLET | Freq: Two times a day (BID) | ORAL | 0 refills | Status: DC

## 2019-05-02 ENCOUNTER — Telehealth (HOSPITAL_COMMUNITY): Payer: Self-pay | Admitting: Emergency Medicine

## 2019-05-02 ENCOUNTER — Encounter (HOSPITAL_COMMUNITY): Payer: Self-pay

## 2019-05-02 NOTE — Telephone Encounter (Signed)
Bacterial vaginosis is positive. This was not treated at the urgent care visit.  Flagyl 500 mg BID x 7 days #14 no refills sent to patients pharmacy of choice.    Attempted to reach patient. No answer at this time. Voicemail left.    

## 2019-05-06 ENCOUNTER — Ambulatory Visit (INDEPENDENT_AMBULATORY_CARE_PROVIDER_SITE_OTHER): Payer: Medicaid Other | Admitting: Cardiology

## 2019-05-06 ENCOUNTER — Other Ambulatory Visit: Payer: Self-pay

## 2019-05-06 ENCOUNTER — Ambulatory Visit (INDEPENDENT_AMBULATORY_CARE_PROVIDER_SITE_OTHER): Payer: Medicaid Other

## 2019-05-06 ENCOUNTER — Encounter: Payer: Self-pay | Admitting: Cardiology

## 2019-05-06 ENCOUNTER — Ambulatory Visit: Payer: Medicaid Other | Admitting: Family

## 2019-05-06 VITALS — BP 110/86 | HR 76 | Ht 59.0 in | Wt 112.0 lb

## 2019-05-06 DIAGNOSIS — R55 Syncope and collapse: Secondary | ICD-10-CM | POA: Diagnosis not present

## 2019-05-06 NOTE — Patient Instructions (Signed)
Medication Instructions:  Your physician recommends that you continue on your current medications as directed. Please refer to the Current Medication list given to you today.  *If you need a refill on your cardiac medications before your next appointment, please call your pharmacy*  Lab Work: None Ordered  If you have labs (blood work) drawn today and your tests are completely normal, you will receive your results only by: Marland Kitchen MyChart Message (if you have MyChart) OR . A paper copy in the mail If you have any lab test that is abnormal or we need to change your treatment, we will call you to review the results.  Testing/Procedures: Your physician has requested that you have an echocardiogram. Echocardiography is a painless test that uses sound waves to create images of your heart. It provides your doctor with information about the size and shape of your heart and how well your heart's chambers and valves are working. This procedure takes approximately one hour. There are no restrictions for this procedure.  ZIO Event Monitor 14 days  Follow-Up: At [facility], you and your health needs are our priority.  As part of our continuing mission to provide you with exceptional heart care, we have created designated Provider Care Teams.  These Care Teams include your primary Cardiologist (physician) and Advanced Practice Providers (APPs -  Physician Assistants and Nurse Practitioners) who all work together to provide you with the care you need, when you need it.  Your next appointment:   3 month(s)  The format for your next appointment:   In Person  Provider:   Berniece Salines, DO  Other Instructions   Echocardiogram An echocardiogram is a procedure that uses painless sound waves (ultrasound) to produce an image of the heart. Images from an echocardiogram can provide important information about:  Signs of coronary artery disease (CAD).  Aneurysm detection. An aneurysm is a weak or damaged  part of an artery wall that bulges out from the normal force of blood pumping through the body.  Heart size and shape. Changes in the size or shape of the heart can be associated with certain conditions, including heart failure, aneurysm, and CAD.  Heart muscle function.  Heart valve function.  Signs of a past heart attack.  Fluid buildup around the heart.  Thickening of the heart muscle.  A tumor or infectious growth around the heart valves. Tell a health care provider about:  Any allergies you have.  All medicines you are taking, including vitamins, herbs, eye drops, creams, and over-the-counter medicines.  Any blood disorders you have.  Any surgeries you have had.  Any medical conditions you have.  Whether you are pregnant or may be pregnant. What are the risks? Generally, this is a safe procedure. However, problems may occur, including:  Allergic reaction to dye (contrast) that may be used during the procedure. What happens before the procedure? No specific preparation is needed. You may eat and drink normally. What happens during the procedure?   An IV tube may be inserted into one of your veins.  You may receive contrast through this tube. A contrast is an injection that improves the quality of the pictures from your heart.  A gel will be applied to your chest.  A wand-like tool (transducer) will be moved over your chest. The gel will help to transmit the sound waves from the transducer.  The sound waves will harmlessly bounce off of your heart to allow the heart images to be captured in real-time motion.  The images will be recorded on a computer. The procedure may vary among health care providers and hospitals. What happens after the procedure?  You may return to your normal, everyday life, including diet, activities, and medicines, unless your health care provider tells you not to do that. Summary  An echocardiogram is a procedure that uses painless sound  waves (ultrasound) to produce an image of the heart.  Images from an echocardiogram can provide important information about the size and shape of your heart, heart muscle function, heart valve function, and fluid buildup around your heart.  You do not need to do anything to prepare before this procedure. You may eat and drink normally.  After the echocardiogram is completed, you may return to your normal, everyday life, unless your health care provider tells you not to do that. This information is not intended to replace advice given to you by your health care provider. Make sure you discuss any questions you have with your health care provider. Document Released: 05/09/2000 Document Revised: 09/02/2018 Document Reviewed: 06/14/2016 Elsevier Patient Education  2020 [employer]    Ambulatory Cardiac Monitoring An ambulatory cardiac monitor is a small recording device that is used to detect abnormal heart rhythms (arrhythmias). Most monitors are connected by wires to flat, sticky disks (electrodes) that are then attached to your chest. You may need to wear a monitor if you have had symptoms such as:  Fast heartbeats (palpitations).  Dizziness.  Fainting or light-headedness.  Unexplained weakness.  Shortness of breath. There are several types of monitors. Some common monitors include:  Holter monitor. This records your heart rhythm continuously, usually for 24-48 hours.  Event (episodic) monitor. This monitor has a symptoms button, and when pushed, it will begin recording. You need to activate this monitor to record when you have a heart-related symptom.  Automatic detection monitor. This monitor will begin recording when it detects an abnormal heartbeat. What are the risks? Generally, these devices are safe to use. However, it is possible that the skin under the electrodes will become irritated. How to prepare for monitoring Your health care provider will prepare your chest for  the electrode placement and show you how to use the monitor.  Do not apply lotions to your chest before monitoring.  Follow directions on how to care for the monitor, and how to return the monitor when the testing period is complete. How to use your cardiac monitor  Follow directions about how long to wear the monitor, and if you can take the monitor off in order to shower or bathe. ? Do not let the monitor get wet. ? Do not bathe, swim, or use a hot tub while wearing the monitor.  Keep your skin clean. Do not put body lotion or moisturizer on your chest.  Change the electrodes as told by your health care provider, or any time they stop sticking to your skin. You may need to use medical tape to keep them on.  Try to put the electrodes in slightly different places on your chest to help prevent skin irritation. Follow directions from your health care provider about where to place the electrodes.  Make sure the monitor is safely clipped to your clothing or in a location close to your body as recommended by your health care provider.  If your monitor has a symptoms button, press the button to mark an event as soon as you feel a heart-related symptom, such as: ? Dizziness. ? Weakness. ? Light-headedness. ? Palpitations. ?  Thumping or pounding in your chest. ? Shortness of breath. ? Unexplained weakness.  Keep a diary of your activities, such as walking, doing chores, and taking medicine. It is very important to note what you were doing when you pushed the button to record your symptoms. This will help your health care provider determine what might be contributing to your symptoms.  Send the recorded information as recommended by your health care provider. It may take some time for your health care provider to process the results.  Change the batteries as told by your health care provider.  Keep electronic devices away from your monitor. These include: ? Tablets. ? MP3 players. ? Cell  phones.  While wearing your monitor you should avoid: ? Electric blankets. ? Firefighterlectric razors. ? Electric toothbrushes. ? Microwave ovens. ? Magnets. ? Metal detectors. Get help right away if:  You have chest pain.  You have shortness of breath or extreme difficulty breathing.  You develop a very fast heartbeat that does not get better.  You develop dizziness that does not go away.  You faint or constantly feel like you are about to faint. Summary  An ambulatory cardiac monitor is a small recording device that is used to detect abnormal heart rhythms (arrhythmias).  Make sure you understand how to send the information from the monitor to your health care provider.  It is important to press the button on the monitor when you have any heart-related symptoms.  Keep a diary of your activities, such as walking, doing chores, and taking medicine. It is very important to note what you were doing when you pushed the button to record your symptoms. This will help your health care provider learn what might be causing your symptoms. This information is not intended to replace advice given to you by your health care provider. Make sure you discuss any questions you have with your health care provider. Document Released: 02/19/2008 Document Revised: 04/24/2017 Document Reviewed: 04/26/2016 Elsevier Patient Education  2020 [employer]

## 2019-05-06 NOTE — Progress Notes (Signed)
Cardiology Office Note:    Date:  05/06/2019   ID:  Cheyenne ComberMegan R Knoblock, DOB May 29, 1990, MRN [id number]  PCP:  Jerrell BelfastJackson, Chelsea, FNP  Cardiologist:  No primary care provider on file.  Electrophysiologist:  None   Referring MD: Jerrell BelfastJackson, Chelsea, FNP   Chief Complaint  Patient presents with  . Palpitations  . Loss of Consciousness    History of Present Illness:    Cheyenne Kelly is a 28 y.o. female with a hx of  Anxiety, Depression, PTSD and vertigo presents to be evaluated due to syncope episodes. The patient reports that she has experienced to episodes of syncope recently. On October 25 she was walking up her stairs with food that bought and suddenly all she could remember was being helped off the ground as she had passed out. She was unsure how long she was on the ground .  The second episode was on November 28 after a bicycle ride, she was walking in her living room when she then experienced another syncope episode. She notes that she did not have any bowel or bladder dysfunction. She states that she did not have any lightheadedness or dizziness.   No other complaints at this time.  Of note she shared with me that in 9th grade she wore an event monitor which was reported as unremarkable. In addition she is under the care of neurology and there have been not diagnosed etiology of her syncope episodes.  Past Medical History:  Diagnosis Date  . Anxiety   . Depression   . PTSD (post-traumatic stress disorder)   . Tinnitus   . Vertigo     Past Surgical History:  Procedure Laterality Date  . APPENDECTOMY    . 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 cetirizine (ZYRTEC) 10 MG tablet Take by mouth.  . desvenlafaxine (PRISTIQ) 50 MG 24 hr tablet Take 50 mg by mouth daily.  Marland Kitchen. gabapentin (NEURONTIN) 100 MG capsule Take 1 capsule (100 mg total) by mouth 3 (three) times daily.  Marland Kitchen. KURVELO 0.15-30 MG-MCG tablet Take 1  tablet by mouth daily.     Allergies:   Amoxicillin   Social History   Socioeconomic History  . Marital status: Single    Spouse name: Not on file  . Number of children: 2  . Years of education: Not on file  . Highest education level: Associate degree: occupational, Scientist, product/process developmenttechnical, or vocational program  Occupational History  . Occupation: unemployed  Tobacco Use  . Smoking status: Current Every Day Smoker    Packs/day: 1.00  . Smokeless tobacco: Never Used  Substance and Sexual Activity  . Alcohol use: Yes    Alcohol/week: 12.0 standard drinks    Types: 12 Cans of beer per week    Comment: 3-6 beers 3-4x wk  . Drug use: No  . Sexual activity: Not on file  Other Topics Concern  . Not on file  Social History Narrative   Patient is right-handed. She is single, lives with her 2 children in a 1 story house. She drinks 1-2 Mt. Dew's or Dr. Pat KocherPepper's a day. She does not exercise.   Social Determinants of Health   Financial Resource Strain:   . Difficulty of Paying Living Expenses: Not on file  Food Insecurity:   . Worried About Programme researcher, broadcasting/film/videounning Out of Food in the Last Year: Not on file  . Ran Out of Food in the Last Year:  Not on file  Transportation Needs:   . Lack of Transportation (Medical): Not on file  . Lack of Transportation (Non-Medical): Not on file  Physical Activity:   . Days of Exercise per Week: Not on file  . Minutes of Exercise per Session: Not on file  Stress:   . Feeling of Stress : Not on file  Social Connections:   . Frequency of Communication with Friends and Family: Not on file  . Frequency of Social Gatherings with Friends and Family: Not on file  . Attends Religious Services: Not on file  . Active Member of Clubs or Organizations: Not on file  . Attends Banker Meetings: Not on file  . Marital Status: Not on file     Family History: The patient's family history includes Bipolar disorder in her sister; Cancer in her maternal grandmother; Diabetes  in her maternal grandfather; Drug abuse in her father; Hypertension in her maternal grandfather; Rectal cancer (age of onset: 55) in her mother; Thyroid disease in her sister.  ROS:   Review of Systems  Constitution: Negative for decreased appetite, fever and weight gain.  HENT: Negative for congestion, ear discharge, hoarse voice and sore throat.   Eyes: Negative for discharge, redness, vision loss in right eye and visual halos.  Cardiovascular: Negative for chest pain, dyspnea on exertion, leg swelling, orthopnea and palpitations.  Respiratory: Negative for cough, hemoptysis, shortness of breath and snoring.   Endocrine: Negative for heat intolerance and polyphagia.  Hematologic/Lymphatic: Negative for bleeding problem. Does not bruise/bleed easily.  Skin: Negative for flushing, nail changes, rash and suspicious lesions.  Musculoskeletal: Negative for arthritis, joint pain, muscle cramps, myalgias, neck pain and stiffness.  Gastrointestinal: Negative for abdominal pain, bowel incontinence, diarrhea and excessive appetite.  Genitourinary: Negative for decreased libido, genital sores and incomplete emptying.  Neurological: Negative for brief paralysis, focal weakness, headaches and loss of balance.  Psychiatric/Behavioral: Negative for altered mental status, depression and suicidal ideas.  Allergic/Immunologic: Negative for HIV exposure and persistent infections.    EKGs/Labs/Other Studies Reviewed:    The following studies were reviewed today:  EKG:  The ekg ordered today demonstrates sinus rhythm, heart rate 62 bpm, there to EKG done on May 04, 2019 no significant change.  Recent Labs: 04/27/2019: Hemoglobin 13.7; Platelets 204  Recent Lipid Panel No results found for: CHOL, TRIG, HDL, CHOLHDL, VLDL, LDLCALC, LDLDIRECT  Physical Exam:    VS:  BP 110/86 (BP Location: Left Arm, Patient Position: Sitting, Cuff Size: Normal)   Pulse 76   Ht  (1.499 m)   Wt 112 lb (50.8 kg)    LMP 04/21/2019   SpO2 100%   BMI 22.62 kg/m     Wt Readings from Last 3 Encounters:  05/06/19 112 lb (50.8 kg)  04/27/19 115 lb (52.2 kg)  03/19/18 105 lb 12.8 oz (48 kg)     GEN: Well nourished, well developed in no acute distress HEENT: Normal NECK: No JVD; No carotid bruits LYMPHATICS: No lymphadenopathy CARDIAC: S1S2 noted,RRR, no murmurs, rubs, gallops RESPIRATORY:  Clear to auscultation without rales, wheezing or rhonchi  ABDOMEN: Soft, non-tender, non-distended, +bowel sounds, no guarding. EXTREMITIES: No edema, No cyanosis, no clubbing MUSCULOSKELETAL:  No edema; No deformity  SKIN: Warm and dry NEUROLOGIC:  Alert and oriented x 3, non-focal PSYCHIATRIC:  Normal affect, good insight  ASSESSMENT:    1. Syncope and collapse    PLAN:    1.  I would like to rule out a cardiovascular etiology  of her palpitation and syncope, therefore at this time I would like to placed a zio patch for 14 days. In additon a transthoracic echocardiogram will be ordered to assess LV/RV function and any structural abnormalities. Once these testing have been performed amd reviewed further reccomendations will be made. For now, I do reccomend that the patient goes to the nearest ED if  symptoms recur.  2.  I did discuss the [place] DMV medical guidelines for driving: "it is prudent to recommend that all persons should be free of syncopal episodes for at least six months to be granted the driving privilege." ([place], Second Edition, Medical Review Branch, Engineer, site, Division of [employer] of Transportation, July 2004)   The patient is in agreement with the above plan. The patient left the office in stable condition.  The patient will follow up in 3 months or sooner if needed.  Medication Adjustments/Labs and Tests Ordered: Current medicines are reviewed at length with the patient today.  Concerns  regarding medicines are outlined above.  Orders Placed This Encounter  Procedures  . ZIO LONG TERM MONITOR (3-14 DAYS)  . EKG 12-Lead  . ECHOCARDIOGRAM COMPLETE   No orders of the defined types were placed in this encounter.   Patient Instructions  Medication Instructions:  Your physician recommends that you continue on your current medications as directed. Please refer to the Current Medication list given to you today.  *If you need a refill on your cardiac medications before your next appointment, please call your pharmacy*  Lab Work: None Ordered  If you have labs (blood work) drawn today and your tests are completely normal, you will receive your results only by: Marland Kitchen MyChart Message (if you have MyChart) OR . A paper copy in the mail If you have any lab test that is abnormal or we need to change your treatment, we will call you to review the results.  Testing/Procedures: Your physician has requested that you have an echocardiogram. Echocardiography is a painless test that uses sound waves to create images of your heart. It provides your doctor with information about the size and shape of your heart and how well your heart's chambers and valves are working. This procedure takes approximately one hour. There are no restrictions for this procedure.  ZIO Event Monitor 14 days  Follow-Up: At [facility], you and your health needs are our priority.  As part of our continuing mission to provide you with exceptional heart care, we have created designated Provider Care Teams.  These Care Teams include your primary Cardiologist (physician) and Advanced Practice Providers (APPs -  Physician Assistants and Nurse Practitioners) who all work together to provide you with the care you need, when you need it.  Your next appointment:   3 month(s)  The format for your next appointment:   In Person  Provider:   Berniece Salines, DO  Other Instructions   Echocardiogram An echocardiogram is a  procedure that uses painless sound waves (ultrasound) to produce an image of the heart. Images from an echocardiogram can provide important information about:  Signs of coronary artery disease (CAD).  Aneurysm detection. An aneurysm is a weak or damaged part of an artery wall that bulges out from the normal force of blood pumping through the body.  Heart size and shape. Changes in the size or shape of the heart can be associated with certain conditions, including heart failure, aneurysm, and CAD.  Heart muscle function.  Heart valve function.  Signs of a past heart attack.  Fluid buildup around the heart.  Thickening of the heart muscle.  A tumor or infectious growth around the heart valves. Tell a health care provider about:  Any allergies you have.  All medicines you are taking, including vitamins, herbs, eye drops, creams, and over-the-counter medicines.  Any blood disorders you have.  Any surgeries you have had.  Any medical conditions you have.  Whether you are pregnant or may be pregnant. What are the risks? Generally, this is a safe procedure. However, problems may occur, including:  Allergic reaction to dye (contrast) that may be used during the procedure. What happens before the procedure? No specific preparation is needed. You may eat and drink normally. What happens during the procedure?   An IV tube may be inserted into one of your veins.  You may receive contrast through this tube. A contrast is an injection that improves the quality of the pictures from your heart.  A gel will be applied to your chest.  A wand-like tool (transducer) will be moved over your chest. The gel will help to transmit the sound waves from the transducer.  The sound waves will harmlessly bounce off of your heart to allow the heart images to be captured in real-time motion. The images will be recorded on a computer. The procedure may vary among health care providers and  hospitals. What happens after the procedure?  You may return to your normal, everyday life, including diet, activities, and medicines, unless your health care provider tells you not to do that. Summary  An echocardiogram is a procedure that uses painless sound waves (ultrasound) to produce an image of the heart.  Images from an echocardiogram can provide important information about the size and shape of your heart, heart muscle function, heart valve function, and fluid buildup around your heart.  You do not need to do anything to prepare before this procedure. You may eat and drink normally.  After the echocardiogram is completed, you may return to your normal, everyday life, unless your health care provider tells you not to do that. This information is not intended to replace advice given to you by your health care provider. Make sure you discuss any questions you have with your health care provider. Document Released: 05/09/2000 Document Revised: 09/02/2018 Document Reviewed: 06/14/2016 Elsevier Patient Education  2020 Elsevier Inc.    Ambulatory Cardiac Monitoring An ambulatory cardiac monitor is a small recording device that is used to detect abnormal heart rhythms (arrhythmias). Most monitors are connected by wires to flat, sticky disks (electrodes) that are then attached to your chest. You may need to wear a monitor if you have had symptoms such as:  Fast heartbeats (palpitations).  Dizziness.  Fainting or light-headedness.  Unexplained weakness.  Shortness of breath. There are several types of monitors. Some common monitors include:  Holter monitor. This records your heart rhythm continuously, usually for 24-48 hours.  Event (episodic) monitor. This monitor has a symptoms button, and when pushed, it will begin recording. You need to activate this monitor to record when you have a heart-related symptom.  Automatic detection monitor. This monitor will begin recording when  it detects an abnormal heartbeat. What are the risks? Generally, these devices are safe to use. However, it is possible that the skin under the electrodes will become irritated. How to prepare for monitoring Your health care provider will prepare your chest for the electrode placement and  show you how to use the monitor.  Do not apply lotions to your chest before monitoring.  Follow directions on how to care for the monitor, and how to return the monitor when the testing period is complete. How to use your cardiac monitor  Follow directions about how long to wear the monitor, and if you can take the monitor off in order to shower or bathe. ? Do not let the monitor get wet. ? Do not bathe, swim, or use a hot tub while wearing the monitor.  Keep your skin clean. Do not put body lotion or moisturizer on your chest.  Change the electrodes as told by your health care provider, or any time they stop sticking to your skin. You may need to use medical tape to keep them on.  Try to put the electrodes in slightly different places on your chest to help prevent skin irritation. Follow directions from your health care provider about where to place the electrodes.  Make sure the monitor is safely clipped to your clothing or in a location close to your body as recommended by your health care provider.  If your monitor has a symptoms button, press the button to mark an event as soon as you feel a heart-related symptom, such as: ? Dizziness. ? Weakness. ? Light-headedness. ? Palpitations. ? Thumping or pounding in your chest. ? Shortness of breath. ? Unexplained weakness.  Keep a diary of your activities, such as walking, doing chores, and taking medicine. It is very important to note what you were doing when you pushed the button to record your symptoms. This will help your health care provider determine what might be contributing to your symptoms.  Send the recorded information as recommended by  your health care provider. It may take some time for your health care provider to process the results.  Change the batteries as told by your health care provider.  Keep electronic devices away from your monitor. These include: ? Tablets. ? MP3 players. ? Cell phones.  While wearing your monitor you should avoid: ? Electric blankets. ? Firefighter. ? Electric toothbrushes. ? Microwave ovens. ? Magnets. ? Metal detectors. Get help right away if:  You have chest pain.  You have shortness of breath or extreme difficulty breathing.  You develop a very fast heartbeat that does not get better.  You develop dizziness that does not go away.  You faint or constantly feel like you are about to faint. Summary  An ambulatory cardiac monitor is a small recording device that is used to detect abnormal heart rhythms (arrhythmias).  Make sure you understand how to send the information from the monitor to your health care provider.  It is important to press the button on the monitor when you have any heart-related symptoms.  Keep a diary of your activities, such as walking, doing chores, and taking medicine. It is very important to note what you were doing when you pushed the button to record your symptoms. This will help your health care provider learn what might be causing your symptoms. This information is not intended to replace advice given to you by your health care provider. Make sure you discuss any questions you have with your health care provider. Document Released: 02/19/2008 Document Revised: 04/24/2017 Document Reviewed: 04/26/2016 Elsevier Patient Education  2020 [employer].     Adopting a Healthy Lifestyle.  Know what a healthy weight is for you (roughly BMI <25) and aim to maintain this   Aim  for 7+ servings of fruits and vegetables daily   65-80+ fluid ounces of water or unsweet tea for healthy kidneys   Limit to max 1 drink of alcohol per day; avoid  smoking/tobacco   Limit animal fats in diet for cholesterol and heart health - choose grass fed whenever available   Avoid highly processed foods, and foods high in saturated/trans fats   Aim for low stress - take time to unwind and care for your mental health   Aim for 150 min of moderate intensity exercise weekly for heart health, and weights twice weekly for bone health   Aim for 7-9 hours of sleep daily   When it comes to diets, agreement about the perfect plan isnt easy to find, even among the experts. Experts at the [facility] [employer] developed an idea known as the Healthy Eating Plate. Just imagine a plate divided into logical, healthy portions.   The emphasis is on diet quality:   Load up on vegetables and fruits - one-half of your plate: Aim for color and variety, and remember that potatoes dont count.   Go for whole grains - one-quarter of your plate: Whole wheat, barley, wheat berries, quinoa, oats, brown rice, and foods made with them. If you want pasta, go with whole wheat pasta.   Protein power - one-quarter of your plate: Fish, chicken, beans, and nuts are all healthy, versatile protein sources. Limit red meat.   The diet, however, does go beyond the plate, offering a few other suggestions.   Use healthy plant oils, such as olive, canola, soy, corn, sunflower and peanut. Check the labels, and avoid partially hydrogenated oil, which have unhealthy trans fats.   If youre thirsty, drink water. Coffee and tea are good in moderation, but skip sugary drinks and limit milk and dairy products to one or two daily servings.   The type of carbohydrate in the diet is more important than the amount. Some sources of carbohydrates, such as vegetables, fruits, whole grains, and beans-are healthier than others.   Finally, stay active  Signed, Thomasene Ripple, DO  05/06/2019 9:09 PM    [facility]

## 2019-05-09 ENCOUNTER — Ambulatory Visit (HOSPITAL_BASED_OUTPATIENT_CLINIC_OR_DEPARTMENT_OTHER): Admission: RE | Admit: 2019-05-09 | Payer: Medicaid Other | Source: Ambulatory Visit

## 2019-05-13 ENCOUNTER — Ambulatory Visit (HOSPITAL_BASED_OUTPATIENT_CLINIC_OR_DEPARTMENT_OTHER): Admission: RE | Admit: 2019-05-13 | Payer: Medicaid Other | Source: Ambulatory Visit

## 2019-06-06 ENCOUNTER — Ambulatory Visit: Payer: Medicaid Other | Admitting: Women's Health

## 2019-06-10 ENCOUNTER — Telehealth: Payer: Self-pay | Admitting: *Deleted

## 2019-06-10 NOTE — Telephone Encounter (Signed)
Telephone call to patient. Left message with monitor results . Also asked that she call back an make her echo appointment.

## 2019-06-10 NOTE — Telephone Encounter (Signed)
-----   Message from Caitlin S Walker, NP sent at 06/10/2019  7:47 AM EST ----- Monitor reviewed. Showed sinus rhythm. No evidence of pauses or arrhthymias which would cause her syncopal events. Good result.  Of note, Dr. Tobb ordered an echo which it looks like has not been completed. Please follow up with the patient.  

## 2019-06-16 ENCOUNTER — Telehealth: Payer: Self-pay

## 2019-06-16 NOTE — Telephone Encounter (Signed)
Left message for patient to call back for results.  

## 2019-06-16 NOTE — Telephone Encounter (Signed)
-----   Message from Alver Sorrow, NP sent at 06/10/2019  7:47 AM EST ----- Monitor reviewed. Showed sinus rhythm. No evidence of pauses or arrhthymias which would cause her syncopal events. Good result.  Of note, Dr. Servando Salina ordered an echo which it looks like has not been completed. Please follow up with the patient.

## 2019-06-28 ENCOUNTER — Other Ambulatory Visit: Payer: Self-pay

## 2019-06-28 ENCOUNTER — Ambulatory Visit (HOSPITAL_COMMUNITY): Payer: Medicaid Other | Attending: Internal Medicine

## 2019-06-28 DIAGNOSIS — R55 Syncope and collapse: Secondary | ICD-10-CM | POA: Insufficient documentation

## 2019-06-29 ENCOUNTER — Encounter: Payer: Self-pay | Admitting: *Deleted

## 2019-07-06 ENCOUNTER — Encounter: Payer: Self-pay | Admitting: Obstetrics & Gynecology

## 2019-07-06 ENCOUNTER — Encounter: Payer: Self-pay | Admitting: Family Medicine

## 2019-07-06 ENCOUNTER — Ambulatory Visit: Payer: Medicaid Other | Admitting: Obstetrics & Gynecology

## 2019-07-27 ENCOUNTER — Ambulatory Visit: Payer: Medicaid Other | Admitting: Cardiology

## 2020-12-28 IMAGING — MR MR BRAIN/IAC WITHOUT AND WITH CONTRAST
11 of 12 series · 39 of 48 positions shown · IV contrast (10ml multihance)
Comparison: Head CT 10/01/2017

CLINICAL DATA: Bilateral ear pain right worse than left. Tinnitus.
Dizziness. Previous history of concussions.

EXAM:
MRI HEAD WITHOUT AND WITH CONTRAST
TECHNIQUE: Multiplanar, multiecho pulse sequences of the brain and surrounding
structures were obtained without and with intravenous contrast.
CONTRAST:  10mL MULTIHANCE GADOBENATE DIMEGLUMINE 529 MG/ML IV SOLN

[Series 2: T1 · sagittal · 5.0mm · 0.45mm/px · 4 of 24 slices shown (1 of 3)]
[im 1/24]
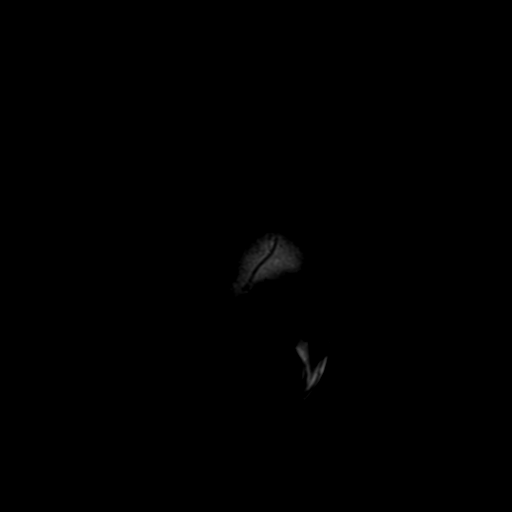
[im 8/24]
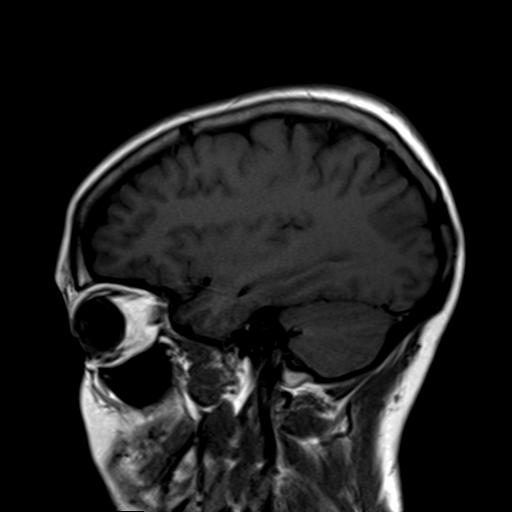
[im 16/24]
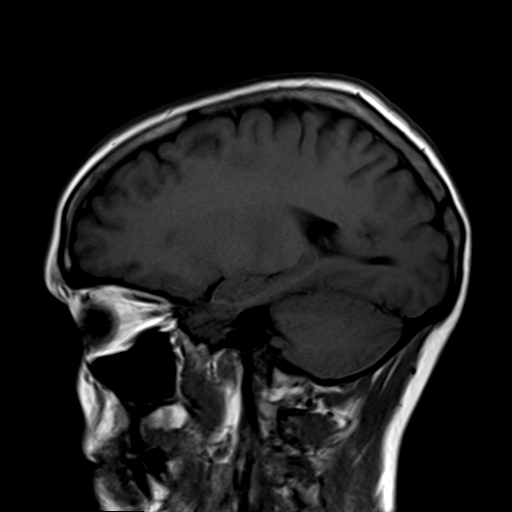
[im 24/24]
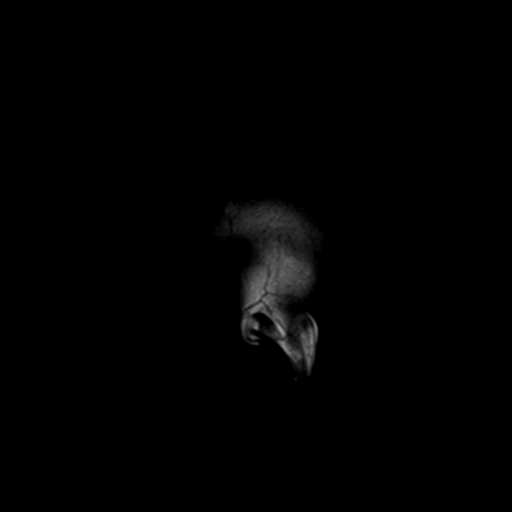

[Series 3: DWI · axial · 3.0mm · 1.80mm/px · z∈[-57,+90]mm · 11 of 100 slices shown (1 of 2)]
[im 1/100]
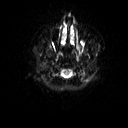
[im 10/100]
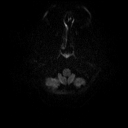
[im 20/100]
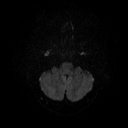
[im 30/100]
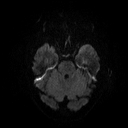
[im 40/100]
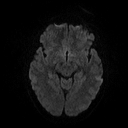
[im 50/100]
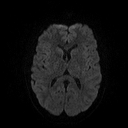
[im 60/100]
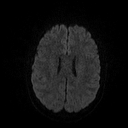
[im 70/100]
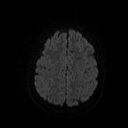
[im 80/100]
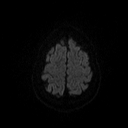
[im 90/100]
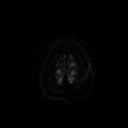
[im 100/100]
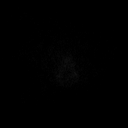

[Series 4: DWI · axial · 3.0mm · 1.80mm/px · z∈[-57,+90]mm · 6 of 50 slices shown (2 of 2)]
[im 1/50]
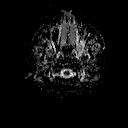
[im 10/50]
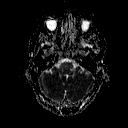
[im 20/50]
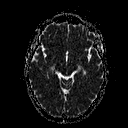
[im 30/50]
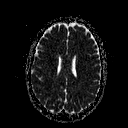
[im 40/50]
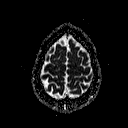
[im 50/50]
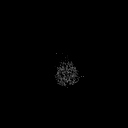

[Series 5: T2 · axial · 5.0mm · 0.45mm/px · z∈[-54,+89]mm · 3 of 23 slices shown]
[im 1/23]
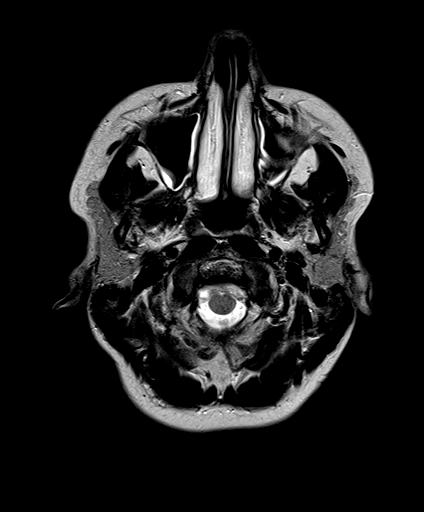
[im 12/23]
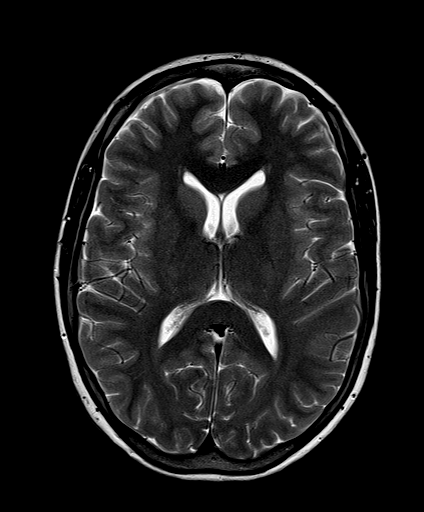
[im 23/23]
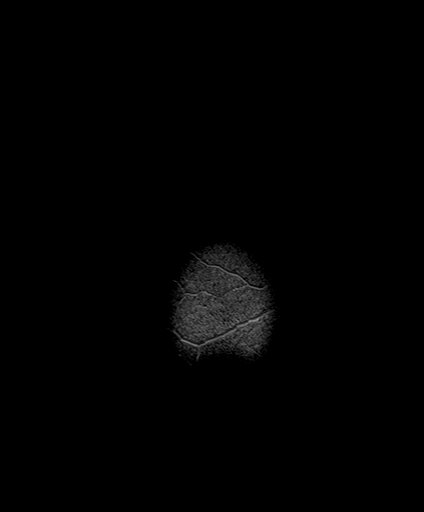

[Series 6: FLAIR · axial · 3.0mm · 0.45mm/px · z∈[-61,+94]mm · 3 of 27 slices shown]
[im 1/27]
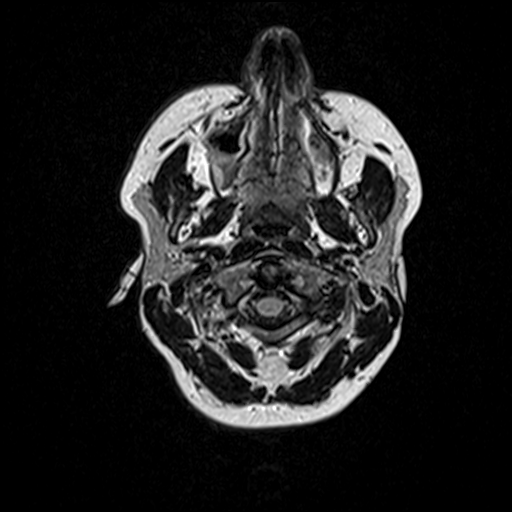
[im 14/27]
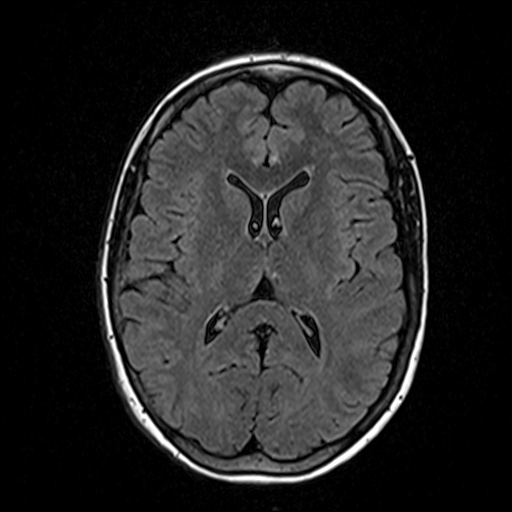
[im 27/27]
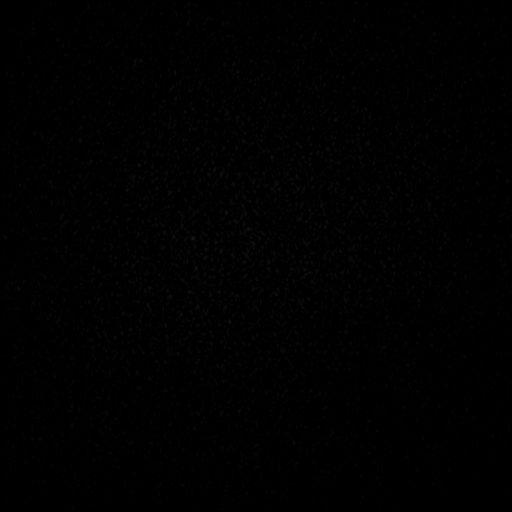

[Series 8: swi_images · axial · 3.0mm · 0.90mm/px · z∈[-52,+88]mm · 5 of 48 slices shown]
[im 1/48]
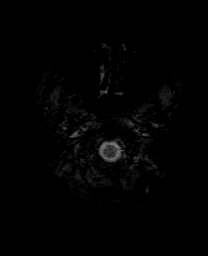
[im 12/48]
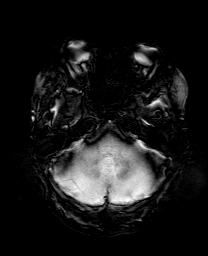
[im 24/48]
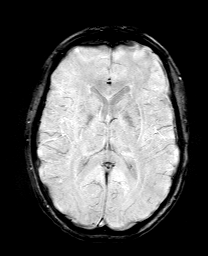
[im 36/48]
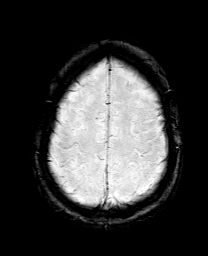
[im 48/48]
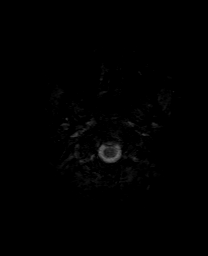

[Series 9: T1 · coronal · 3.0mm · 0.35mm/px · 1 of 11 slices shown (2 of 3)]
[im 1/11]
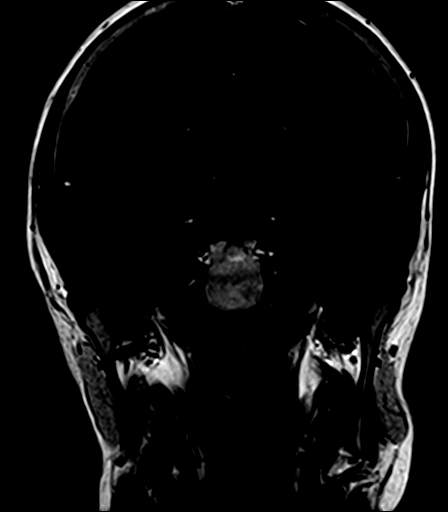

[Series 10: T1 · axial · 3.0mm · 0.35mm/px · 1 of 11 slices shown (3 of 3)]
[im 1/11]
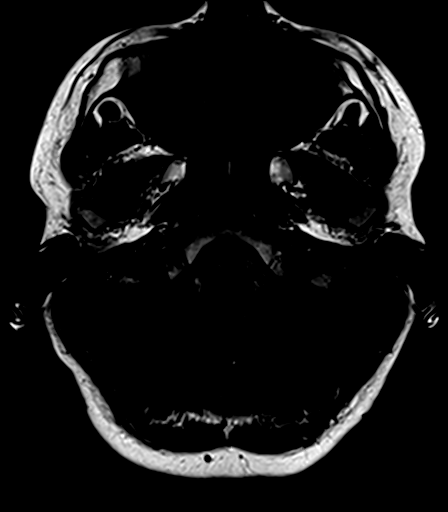

[Series 11: bSSFP · axial · 1.0mm · 0.28mm/px · z∈[-41,-18]mm · 3 of 36 slices shown]
[im 1/36]
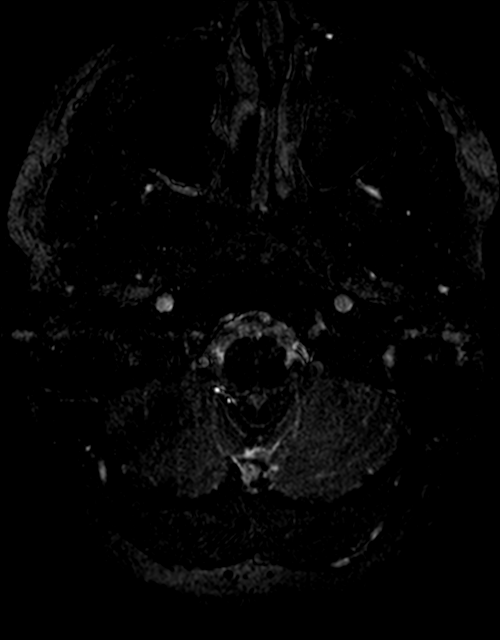
[im 12/36]
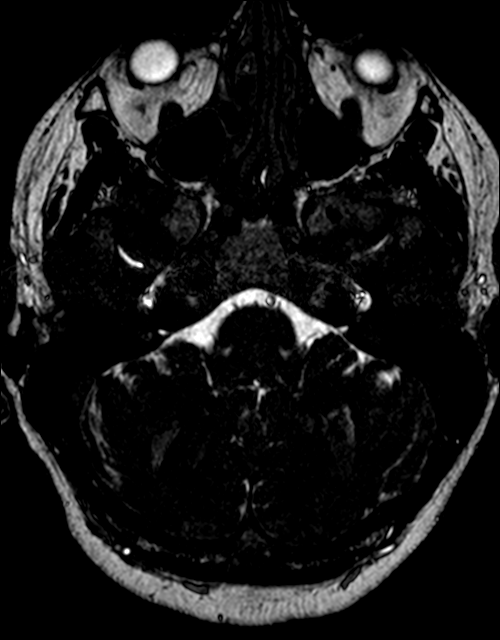
[im 24/36]
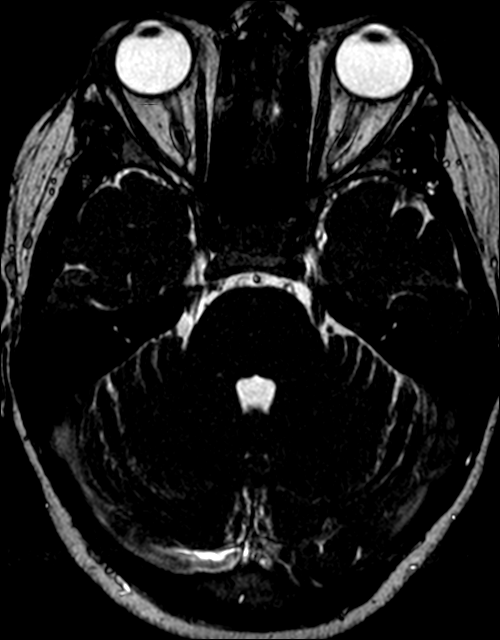

[Series 12: T1 post-contrast · coronal · 3.0mm · 0.35mm/px · 1 of 11 slices shown (1 of 2)]
[im 1/11]
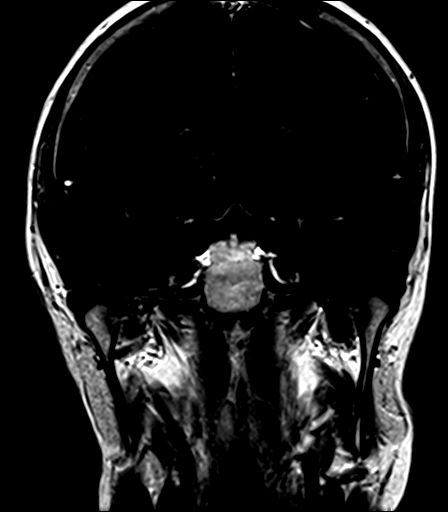

[Series 13: T1 post-contrast · axial · 3.0mm · 0.35mm/px · 1 of 11 slices shown (2 of 2)]
[im 1/11]
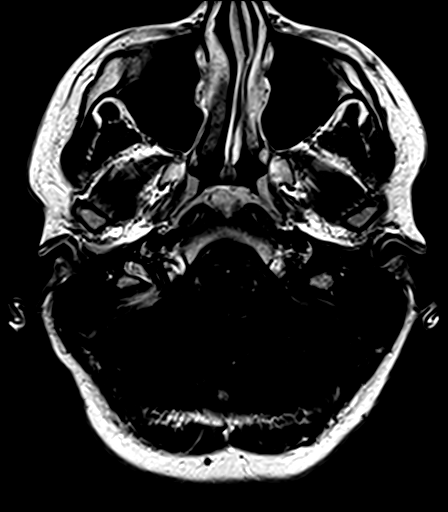

[39 of 48 positions shown; findings below may reference images not displayed]

FINDINGS: Brain: The brain has a normal appearance without evidence of
malformation, atrophy, old or acute small or large vessel
infarction, mass lesion, hemorrhage, hydrocephalus or extra-axial
collection. CP angle regions are normal. Seventh and eighth nerve
complexes are normal. No vestibular schwannoma or enhancing neuritis

Vascular: Major vessels at the base of the brain show flow. Venous
sinuses appear patent.

Skull and upper cervical spine: Normal.

Sinuses/Orbits: Clear/normal. No fluid seen in the middle ears or
mastoids.

Other: None significant.
IMPRESSION: Normal examination. No abnormality seen to explain the clinical
presentation.

## 2023-07-24 ENCOUNTER — Ambulatory Visit: Payer: Self-pay | Admitting: Internal Medicine
